# Patient Record
Sex: Female | Born: 1935 | Race: White | Hispanic: No | Smoking: Former smoker
Health system: Southern US, Community
[De-identification: ages and names within clinical notes are randomized; demographics above are authoritative.]

## PROBLEM LIST (undated history)

## (undated) DIAGNOSIS — J189 Pneumonia, unspecified organism: Secondary | ICD-10-CM

## (undated) DIAGNOSIS — M810 Age-related osteoporosis without current pathological fracture: Secondary | ICD-10-CM

## (undated) DIAGNOSIS — I639 Cerebral infarction, unspecified: Secondary | ICD-10-CM

## (undated) DIAGNOSIS — N200 Calculus of kidney: Secondary | ICD-10-CM

## (undated) DIAGNOSIS — Z87440 Personal history of urinary (tract) infections: Secondary | ICD-10-CM

## (undated) DIAGNOSIS — G629 Polyneuropathy, unspecified: Secondary | ICD-10-CM

## (undated) DIAGNOSIS — J449 Chronic obstructive pulmonary disease, unspecified: Secondary | ICD-10-CM

## (undated) DIAGNOSIS — C9590 Leukemia, unspecified not having achieved remission: Secondary | ICD-10-CM

## (undated) DIAGNOSIS — C50912 Malignant neoplasm of unspecified site of left female breast: Secondary | ICD-10-CM

## (undated) DIAGNOSIS — C50919 Malignant neoplasm of unspecified site of unspecified female breast: Secondary | ICD-10-CM

## (undated) DIAGNOSIS — E039 Hypothyroidism, unspecified: Secondary | ICD-10-CM

## (undated) HISTORY — PX: OVARIAN CYST REMOVAL: SHX89

## (undated) HISTORY — DX: Pneumonia, unspecified organism: J18.9

## (undated) HISTORY — PX: REPLACEMENT TOTAL KNEE: SUR1224

## (undated) HISTORY — PX: CHOLECYSTECTOMY: SHX55

## (undated) HISTORY — PX: APPENDECTOMY: SHX54

## (undated) HISTORY — DX: Chronic obstructive pulmonary disease, unspecified: J44.9

## (undated) HISTORY — PX: BARTHOLIN GLAND CYST EXCISION: SHX565

## (undated) HISTORY — PX: CARDIAC CATHETERIZATION: SHX172

## (undated) HISTORY — DX: Cerebral infarction, unspecified: I63.9

## (undated) HISTORY — DX: Age-related osteoporosis without current pathological fracture: M81.0

## (undated) HISTORY — DX: Calculus of kidney: N20.0

## (undated) HISTORY — DX: Hypothyroidism, unspecified: E03.9

## (undated) HISTORY — PX: BREAST LUMPECTOMY: SHX2

## (undated) HISTORY — PX: ABDOMINAL HYSTERECTOMY: SHX81

## (undated) HISTORY — DX: Malignant neoplasm of unspecified site of left female breast: C50.912

## (undated) HISTORY — DX: Leukemia, unspecified not having achieved remission: C95.90

## (undated) HISTORY — DX: Personal history of urinary (tract) infections: Z87.440

## (undated) HISTORY — PX: LITHOTRIPSY: SUR834

## (undated) HISTORY — DX: Malignant neoplasm of unspecified site of unspecified female breast: C50.919

## (undated) HISTORY — PX: TOTAL HIP ARTHROPLASTY: SHX124

---
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
## 1977-01-13 HISTORY — PX: KIDNEY STONE SURGERY: SHX686

## 1997-04-20 ENCOUNTER — Ambulatory Visit (HOSPITAL_BASED_OUTPATIENT_CLINIC_OR_DEPARTMENT_OTHER): Admission: RE | Admit: 1997-04-20 | Discharge: 1997-04-20 | Payer: Self-pay | Admitting: General Surgery

## 1998-06-04 ENCOUNTER — Encounter: Payer: Self-pay | Admitting: Orthopedic Surgery

## 1998-06-06 ENCOUNTER — Inpatient Hospital Stay (HOSPITAL_COMMUNITY): Admission: RE | Admit: 1998-06-06 | Discharge: 1998-06-18 | Payer: Self-pay | Admitting: Orthopedic Surgery

## 1998-06-11 ENCOUNTER — Encounter: Payer: Self-pay | Admitting: Orthopedic Surgery

## 1998-06-12 ENCOUNTER — Encounter: Payer: Self-pay | Admitting: Orthopedic Surgery

## 1998-06-13 ENCOUNTER — Encounter: Payer: Self-pay | Admitting: Orthopedic Surgery

## 1998-06-14 ENCOUNTER — Encounter: Payer: Self-pay | Admitting: Orthopedic Surgery

## 1998-06-15 ENCOUNTER — Encounter: Payer: Self-pay | Admitting: Orthopedic Surgery

## 2001-06-15 ENCOUNTER — Ambulatory Visit (HOSPITAL_COMMUNITY): Admission: RE | Admit: 2001-06-15 | Discharge: 2001-06-15 | Payer: Self-pay | Admitting: Family Medicine

## 2001-06-15 ENCOUNTER — Encounter: Payer: Self-pay | Admitting: Family Medicine

## 2001-12-02 ENCOUNTER — Encounter: Payer: Self-pay | Admitting: Family Medicine

## 2001-12-02 ENCOUNTER — Ambulatory Visit (HOSPITAL_COMMUNITY): Admission: RE | Admit: 2001-12-02 | Discharge: 2001-12-02 | Payer: Self-pay | Admitting: Family Medicine

## 2002-01-05 ENCOUNTER — Inpatient Hospital Stay (HOSPITAL_COMMUNITY): Admission: EM | Admit: 2002-01-05 | Discharge: 2002-01-08 | Payer: Self-pay | Admitting: Emergency Medicine

## 2002-01-05 ENCOUNTER — Encounter: Payer: Self-pay | Admitting: Emergency Medicine

## 2002-01-07 ENCOUNTER — Encounter: Payer: Self-pay | Admitting: Internal Medicine

## 2002-06-22 ENCOUNTER — Encounter: Payer: Self-pay | Admitting: Internal Medicine

## 2002-06-22 DIAGNOSIS — K2900 Acute gastritis without bleeding: Secondary | ICD-10-CM | POA: Insufficient documentation

## 2002-06-22 DIAGNOSIS — D126 Benign neoplasm of colon, unspecified: Secondary | ICD-10-CM

## 2002-07-31 ENCOUNTER — Emergency Department (HOSPITAL_COMMUNITY): Admission: EM | Admit: 2002-07-31 | Discharge: 2002-07-31 | Payer: Self-pay | Admitting: Emergency Medicine

## 2002-09-28 ENCOUNTER — Encounter: Payer: Self-pay | Admitting: Family Medicine

## 2002-09-28 ENCOUNTER — Ambulatory Visit (HOSPITAL_COMMUNITY): Admission: RE | Admit: 2002-09-28 | Discharge: 2002-09-28 | Payer: Self-pay | Admitting: Family Medicine

## 2003-02-15 ENCOUNTER — Ambulatory Visit (HOSPITAL_COMMUNITY): Admission: RE | Admit: 2003-02-15 | Discharge: 2003-02-15 | Payer: Self-pay | Admitting: Family Medicine

## 2003-03-02 ENCOUNTER — Ambulatory Visit (HOSPITAL_COMMUNITY): Admission: RE | Admit: 2003-03-02 | Discharge: 2003-03-02 | Payer: Self-pay | Admitting: Family Medicine

## 2003-03-07 ENCOUNTER — Encounter: Admission: RE | Admit: 2003-03-07 | Discharge: 2003-03-07 | Payer: Self-pay | Admitting: Oncology

## 2003-03-07 ENCOUNTER — Encounter (HOSPITAL_COMMUNITY): Admission: RE | Admit: 2003-03-07 | Discharge: 2003-04-06 | Payer: Self-pay | Admitting: Oncology

## 2003-03-13 ENCOUNTER — Ambulatory Visit (HOSPITAL_COMMUNITY): Admission: RE | Admit: 2003-03-13 | Discharge: 2003-03-13 | Payer: Self-pay | Admitting: Pulmonary Disease

## 2003-08-14 ENCOUNTER — Ambulatory Visit (HOSPITAL_COMMUNITY): Admission: RE | Admit: 2003-08-14 | Discharge: 2003-08-14 | Payer: Self-pay | Admitting: *Deleted

## 2003-09-08 ENCOUNTER — Ambulatory Visit (HOSPITAL_COMMUNITY): Admission: RE | Admit: 2003-09-08 | Discharge: 2003-09-08 | Payer: Self-pay | Admitting: Family Medicine

## 2003-09-20 ENCOUNTER — Encounter: Admission: RE | Admit: 2003-09-20 | Discharge: 2003-10-13 | Payer: Self-pay | Admitting: Oncology

## 2004-02-05 ENCOUNTER — Ambulatory Visit (HOSPITAL_COMMUNITY): Admission: RE | Admit: 2004-02-05 | Discharge: 2004-02-05 | Payer: Self-pay | Admitting: Family Medicine

## 2004-08-16 ENCOUNTER — Encounter: Admission: RE | Admit: 2004-08-16 | Discharge: 2004-08-16 | Payer: Self-pay | Admitting: Oncology

## 2004-08-16 ENCOUNTER — Ambulatory Visit (HOSPITAL_COMMUNITY): Payer: Self-pay | Admitting: Oncology

## 2004-08-16 ENCOUNTER — Encounter (HOSPITAL_COMMUNITY): Admission: RE | Admit: 2004-08-16 | Discharge: 2004-09-15 | Payer: Self-pay | Admitting: Oncology

## 2005-02-26 ENCOUNTER — Encounter: Admission: RE | Admit: 2005-02-26 | Discharge: 2005-02-26 | Payer: Self-pay | Admitting: Oncology

## 2005-02-26 ENCOUNTER — Encounter (HOSPITAL_COMMUNITY): Admission: RE | Admit: 2005-02-26 | Discharge: 2005-03-28 | Payer: Self-pay | Admitting: Oncology

## 2005-07-01 ENCOUNTER — Emergency Department (HOSPITAL_COMMUNITY): Admission: EM | Admit: 2005-07-01 | Discharge: 2005-07-01 | Payer: Self-pay | Admitting: Emergency Medicine

## 2005-07-07 ENCOUNTER — Ambulatory Visit (HOSPITAL_COMMUNITY): Admission: RE | Admit: 2005-07-07 | Discharge: 2005-07-07 | Payer: Self-pay | Admitting: Urology

## 2006-06-22 ENCOUNTER — Ambulatory Visit (HOSPITAL_COMMUNITY): Admission: RE | Admit: 2006-06-22 | Discharge: 2006-06-22 | Payer: Self-pay | Admitting: Family Medicine

## 2006-07-10 ENCOUNTER — Ambulatory Visit (HOSPITAL_COMMUNITY): Admission: RE | Admit: 2006-07-10 | Discharge: 2006-07-10 | Payer: Self-pay | Admitting: Family Medicine

## 2006-08-25 ENCOUNTER — Ambulatory Visit (HOSPITAL_COMMUNITY): Payer: Self-pay | Admitting: Oncology

## 2006-12-10 ENCOUNTER — Encounter (INDEPENDENT_AMBULATORY_CARE_PROVIDER_SITE_OTHER): Payer: Self-pay | Admitting: Family Medicine

## 2006-12-10 ENCOUNTER — Observation Stay (HOSPITAL_COMMUNITY): Admission: EM | Admit: 2006-12-10 | Discharge: 2006-12-11 | Payer: Self-pay | Admitting: Emergency Medicine

## 2006-12-24 ENCOUNTER — Ambulatory Visit: Payer: Self-pay | Admitting: Internal Medicine

## 2007-01-11 DIAGNOSIS — N200 Calculus of kidney: Secondary | ICD-10-CM | POA: Insufficient documentation

## 2007-01-11 DIAGNOSIS — J449 Chronic obstructive pulmonary disease, unspecified: Secondary | ICD-10-CM

## 2007-01-11 DIAGNOSIS — J4489 Other specified chronic obstructive pulmonary disease: Secondary | ICD-10-CM | POA: Insufficient documentation

## 2007-01-11 DIAGNOSIS — E785 Hyperlipidemia, unspecified: Secondary | ICD-10-CM | POA: Insufficient documentation

## 2007-01-11 DIAGNOSIS — R0602 Shortness of breath: Secondary | ICD-10-CM | POA: Insufficient documentation

## 2007-01-11 DIAGNOSIS — I6350 Cerebral infarction due to unspecified occlusion or stenosis of unspecified cerebral artery: Secondary | ICD-10-CM | POA: Insufficient documentation

## 2007-01-11 DIAGNOSIS — E232 Diabetes insipidus: Secondary | ICD-10-CM

## 2007-01-11 DIAGNOSIS — C9110 Chronic lymphocytic leukemia of B-cell type not having achieved remission: Secondary | ICD-10-CM

## 2007-01-11 DIAGNOSIS — K269 Duodenal ulcer, unspecified as acute or chronic, without hemorrhage or perforation: Secondary | ICD-10-CM | POA: Insufficient documentation

## 2007-01-11 DIAGNOSIS — G459 Transient cerebral ischemic attack, unspecified: Secondary | ICD-10-CM | POA: Insufficient documentation

## 2007-02-04 ENCOUNTER — Ambulatory Visit (HOSPITAL_COMMUNITY): Admission: RE | Admit: 2007-02-04 | Discharge: 2007-02-04 | Payer: Self-pay | Admitting: Family Medicine

## 2007-02-08 ENCOUNTER — Ambulatory Visit (HOSPITAL_COMMUNITY): Admission: RE | Admit: 2007-02-08 | Discharge: 2007-02-08 | Payer: Self-pay | Admitting: Family Medicine

## 2007-10-26 ENCOUNTER — Ambulatory Visit (HOSPITAL_COMMUNITY): Admission: RE | Admit: 2007-10-26 | Discharge: 2007-10-26 | Payer: Self-pay | Admitting: Family Medicine

## 2008-11-07 ENCOUNTER — Inpatient Hospital Stay (HOSPITAL_COMMUNITY): Admission: EM | Admit: 2008-11-07 | Discharge: 2008-11-08 | Payer: Self-pay | Admitting: Emergency Medicine

## 2009-09-03 ENCOUNTER — Ambulatory Visit (HOSPITAL_COMMUNITY): Payer: Self-pay | Admitting: Oncology

## 2009-09-03 ENCOUNTER — Encounter (HOSPITAL_COMMUNITY): Admission: RE | Admit: 2009-09-03 | Discharge: 2009-10-03 | Payer: Self-pay | Admitting: Oncology

## 2009-09-06 ENCOUNTER — Emergency Department (HOSPITAL_COMMUNITY): Admission: EM | Admit: 2009-09-06 | Discharge: 2009-09-06 | Payer: Self-pay | Admitting: Emergency Medicine

## 2009-09-13 DIAGNOSIS — I639 Cerebral infarction, unspecified: Secondary | ICD-10-CM

## 2009-09-13 HISTORY — DX: Cerebral infarction, unspecified: I63.9

## 2009-10-11 ENCOUNTER — Inpatient Hospital Stay (HOSPITAL_COMMUNITY): Admission: EM | Admit: 2009-10-11 | Discharge: 2009-10-13 | Payer: Self-pay | Admitting: Emergency Medicine

## 2009-10-11 ENCOUNTER — Ambulatory Visit: Payer: Self-pay | Admitting: Cardiology

## 2009-10-12 ENCOUNTER — Encounter (INDEPENDENT_AMBULATORY_CARE_PROVIDER_SITE_OTHER): Payer: Self-pay | Admitting: Internal Medicine

## 2009-11-02 ENCOUNTER — Encounter (HOSPITAL_COMMUNITY)
Admission: RE | Admit: 2009-11-02 | Discharge: 2009-12-02 | Payer: Self-pay | Source: Home / Self Care | Admitting: Oncology

## 2010-01-25 ENCOUNTER — Encounter (HOSPITAL_COMMUNITY)
Admission: RE | Admit: 2010-01-25 | Discharge: 2010-02-12 | Payer: Self-pay | Source: Home / Self Care | Attending: Oncology | Admitting: Oncology

## 2010-01-25 ENCOUNTER — Ambulatory Visit (HOSPITAL_COMMUNITY)
Admission: RE | Admit: 2010-01-25 | Discharge: 2010-02-12 | Payer: Self-pay | Source: Home / Self Care | Attending: Oncology | Admitting: Oncology

## 2010-01-28 LAB — CBC
HCT: 42.7 % (ref 36.0–46.0)
Hemoglobin: 14.6 g/dL (ref 12.0–15.0)
MCH: 32.6 pg (ref 26.0–34.0)
MCHC: 34.2 g/dL (ref 30.0–36.0)
MCV: 95.3 fL (ref 78.0–100.0)
Platelets: 141 10*3/uL — ABNORMAL LOW (ref 150–400)
RBC: 4.48 MIL/uL (ref 3.87–5.11)
RDW: 13.4 % (ref 11.5–15.5)
WBC: 18.8 10*3/uL — ABNORMAL HIGH (ref 4.0–10.5)

## 2010-01-28 LAB — DIFFERENTIAL
Basophils Absolute: 0 10*3/uL (ref 0.0–0.1)
Basophils Relative: 0 % (ref 0–1)
Eosinophils Absolute: 0.1 10*3/uL (ref 0.0–0.7)
Eosinophils Relative: 1 % (ref 0–5)
Lymphocytes Relative: 70 % — ABNORMAL HIGH (ref 12–46)
Lymphs Abs: 13.2 10*3/uL — ABNORMAL HIGH (ref 0.7–4.0)
Monocytes Absolute: 0.3 10*3/uL (ref 0.1–1.0)
Monocytes Relative: 2 % — ABNORMAL LOW (ref 3–12)
Neutro Abs: 5.2 10*3/uL (ref 1.7–7.7)
Neutrophils Relative %: 28 % — ABNORMAL LOW (ref 43–77)

## 2010-02-03 ENCOUNTER — Encounter: Payer: Self-pay | Admitting: Urology

## 2010-03-27 LAB — DIFFERENTIAL
Basophils Absolute: 0 10*3/uL (ref 0.0–0.1)
Basophils Relative: 0 % (ref 0–1)
Eosinophils Absolute: 0.1 10*3/uL (ref 0.0–0.7)
Eosinophils Relative: 1 % (ref 0–5)
Eosinophils Relative: 1 % (ref 0–5)
Lymphocytes Relative: 68 % — ABNORMAL HIGH (ref 12–46)
Lymphocytes Relative: 66 % — ABNORMAL HIGH (ref 12–46)
Lymphs Abs: 11.2 10*3/uL — ABNORMAL HIGH (ref 0.7–4.0)
Lymphs Abs: 7.8 10*3/uL — ABNORMAL HIGH (ref 0.7–4.0)
Monocytes Absolute: 0.5 10*3/uL (ref 0.1–1.0)
Monocytes Relative: 2 % — ABNORMAL LOW (ref 3–12)
Monocytes Relative: 4 % (ref 3–12)
Neutro Abs: 3.4 10*3/uL (ref 1.7–7.7)
Neutrophils Relative %: 28 % — ABNORMAL LOW (ref 43–77)
Neutrophils Relative %: 29 % — ABNORMAL LOW (ref 43–77)

## 2010-03-27 LAB — CBC
HCT: 38.3 % (ref 36.0–46.0)
Hemoglobin: 13.7 g/dL (ref 12.0–15.0)
Hemoglobin: 13.2 g/dL (ref 12.0–15.0)
MCH: 33.5 pg (ref 26.0–34.0)
MCH: 33.4 pg (ref 26.0–34.0)
MCHC: 34.5 g/dL (ref 30.0–36.0)
MCHC: 34.5 g/dL (ref 30.0–36.0)
MCV: 96.6 fL (ref 78.0–100.0)
Platelets: 118 10*3/uL — ABNORMAL LOW (ref 150–400)
RBC: 4.09 MIL/uL (ref 3.87–5.11)
RBC: 3.96 MIL/uL (ref 3.87–5.11)
RDW: 12.9 % (ref 11.5–15.5)
WBC: 11.8 10*3/uL — ABNORMAL HIGH (ref 4.0–10.5)

## 2010-03-27 LAB — GLUCOSE, CAPILLARY
Glucose-Capillary: 111 mg/dL — ABNORMAL HIGH (ref 70–99)
Glucose-Capillary: 129 mg/dL — ABNORMAL HIGH (ref 70–99)

## 2010-03-28 LAB — DIFFERENTIAL
Basophils Absolute: 0 10*3/uL (ref 0.0–0.1)
Basophils Relative: 1 % (ref 0–1)
Basophils Relative: 1 % (ref 0–1)
Eosinophils Absolute: 0.2 10*3/uL (ref 0.0–0.7)
Eosinophils Relative: 1 % (ref 0–5)
Eosinophils Relative: 2 % (ref 0–5)
Eosinophils Relative: 1 % (ref 0–5)
Lymphocytes Relative: 70 % — ABNORMAL HIGH (ref 12–46)
Lymphs Abs: 10.6 10*3/uL — ABNORMAL HIGH (ref 0.7–4.0)
Lymphs Abs: 9.5 10*3/uL — ABNORMAL HIGH (ref 0.7–4.0)
Monocytes Absolute: 0.4 10*3/uL (ref 0.1–1.0)
Monocytes Relative: 3 % (ref 3–12)
Monocytes Relative: 3 % (ref 3–12)
Monocytes Relative: 3 % (ref 3–12)
Neutro Abs: 4.1 10*3/uL (ref 1.7–7.7)
Neutrophils Relative %: 26 % — ABNORMAL LOW (ref 43–77)

## 2010-03-28 LAB — GLUCOSE, CAPILLARY
Glucose-Capillary: 114 mg/dL — ABNORMAL HIGH (ref 70–99)
Glucose-Capillary: 121 mg/dL — ABNORMAL HIGH (ref 70–99)
Glucose-Capillary: 123 mg/dL — ABNORMAL HIGH (ref 70–99)

## 2010-03-28 LAB — CBC
HCT: 37.3 % (ref 36.0–46.0)
HCT: 38.2 % (ref 36.0–46.0)
HCT: 39.9 % (ref 36.0–46.0)
Hemoglobin: 12.8 g/dL (ref 12.0–15.0)
Hemoglobin: 13.7 g/dL (ref 12.0–15.0)
MCH: 33.3 pg (ref 26.0–34.0)
MCH: 33.4 pg (ref 26.0–34.0)
MCHC: 34.4 g/dL (ref 30.0–36.0)
MCHC: 34.7 g/dL (ref 30.0–36.0)
MCHC: 34.3 g/dL (ref 30.0–36.0)
MCV: 97 fL (ref 78.0–100.0)
MCV: 97.3 fL (ref 78.0–100.0)
Platelets: 121 10*3/uL — ABNORMAL LOW (ref 150–400)
RDW: 12.9 % (ref 11.5–15.5)
RDW: 13.4 % (ref 11.5–15.5)
WBC: 15.3 10*3/uL — ABNORMAL HIGH (ref 4.0–10.5)

## 2010-03-28 LAB — COMPREHENSIVE METABOLIC PANEL
Albumin: 3.9 g/dL (ref 3.5–5.2)
Alkaline Phosphatase: 42 U/L (ref 39–117)
BUN: 14 mg/dL (ref 6–23)
Calcium: 8.8 mg/dL (ref 8.4–10.5)
Creatinine, Ser: 0.91 mg/dL (ref 0.4–1.2)
Glucose, Bld: 124 mg/dL — ABNORMAL HIGH (ref 70–99)
Potassium: 3.3 mEq/L — ABNORMAL LOW (ref 3.5–5.1)
Total Protein: 6.2 g/dL (ref 6.0–8.3)

## 2010-03-28 LAB — IRON AND TIBC
Iron: 70 ug/dL (ref 42–135)
UIBC: 267 ug/dL

## 2010-03-28 LAB — LIPID PANEL
HDL: 37 mg/dL — ABNORMAL LOW (ref >39)
Total CHOL/HDL Ratio: 4.5 RATIO
Triglycerides: 103 mg/dL (ref <150)
VLDL: 21 mg/dL (ref 0–40)

## 2010-03-28 LAB — BASIC METABOLIC PANEL
BUN: 11 mg/dL (ref 6–23)
Calcium: 8.7 mg/dL (ref 8.4–10.5)
GFR calc non Af Amer: >60 mL/min (ref >60)
Glucose, Bld: 121 mg/dL — ABNORMAL HIGH (ref 70–99)
Sodium: 143 mEq/L (ref 135–145)

## 2010-03-28 LAB — URINALYSIS, ROUTINE W REFLEX MICROSCOPIC
Glucose, UA: NEGATIVE mg/dL
Ketones, ur: NEGATIVE mg/dL
Leukocytes, UA: NEGATIVE
Protein, ur: NEGATIVE mg/dL
pH: 6 (ref 5.0–8.0)

## 2010-03-28 LAB — PROTIME-INR
INR: 1 (ref 0.00–1.49)
Prothrombin Time: 13.4 seconds (ref 11.6–15.2)

## 2010-03-28 LAB — CARDIAC PANEL(CRET KIN+CKTOT+MB+TROPI)
CK, MB: 0.8 ng/mL (ref 0.3–4.0)
Troponin I: <0.01 ng/mL (ref 0.00–0.06)

## 2010-03-28 LAB — FERRITIN: Ferritin: 50 ng/mL (ref 10–291)

## 2010-03-28 LAB — RETICULOCYTES
RBC.: 4.1 MIL/uL (ref 3.87–5.11)
Retic Ct Pct: 1.3 % (ref 0.4–3.1)

## 2010-03-28 LAB — VITAMIN B12: Vitamin B-12: 235 pg/mL (ref 211–911)

## 2010-03-28 LAB — APTT: aPTT: 26 seconds (ref 24–37)

## 2010-03-28 LAB — URINE MICROSCOPIC-ADD ON

## 2010-03-28 LAB — TROPONIN I: Troponin I: <0.01 ng/mL (ref 0.00–0.06)

## 2010-03-30 ENCOUNTER — Emergency Department (HOSPITAL_COMMUNITY): Payer: PRIVATE HEALTH INSURANCE

## 2010-03-30 ENCOUNTER — Emergency Department (HOSPITAL_COMMUNITY)
Admission: EM | Admit: 2010-03-30 | Discharge: 2010-03-30 | Disposition: A | Discharge status: Home / Self Care | Payer: PRIVATE HEALTH INSURANCE | Attending: Emergency Medicine | Admitting: Emergency Medicine

## 2010-03-30 DIAGNOSIS — Y929 Unspecified place or not applicable: Secondary | ICD-10-CM | POA: Insufficient documentation

## 2010-03-30 DIAGNOSIS — J449 Chronic obstructive pulmonary disease, unspecified: Secondary | ICD-10-CM | POA: Insufficient documentation

## 2010-03-30 DIAGNOSIS — Z8673 Personal history of transient ischemic attack (TIA), and cerebral infarction without residual deficits: Secondary | ICD-10-CM | POA: Insufficient documentation

## 2010-03-30 DIAGNOSIS — S83106A Unspecified dislocation of unspecified knee, initial encounter: Secondary | ICD-10-CM | POA: Insufficient documentation

## 2010-03-30 DIAGNOSIS — M25569 Pain in unspecified knee: Secondary | ICD-10-CM | POA: Insufficient documentation

## 2010-03-30 DIAGNOSIS — W19XXXA Unspecified fall, initial encounter: Secondary | ICD-10-CM | POA: Insufficient documentation

## 2010-03-30 DIAGNOSIS — E119 Type 2 diabetes mellitus without complications: Secondary | ICD-10-CM | POA: Insufficient documentation

## 2010-03-30 DIAGNOSIS — J4489 Other specified chronic obstructive pulmonary disease: Secondary | ICD-10-CM | POA: Insufficient documentation

## 2010-03-30 DIAGNOSIS — K219 Gastro-esophageal reflux disease without esophagitis: Secondary | ICD-10-CM | POA: Insufficient documentation

## 2010-03-30 DIAGNOSIS — Z856 Personal history of leukemia: Secondary | ICD-10-CM | POA: Insufficient documentation

## 2010-04-01 ENCOUNTER — Inpatient Hospital Stay (HOSPITAL_COMMUNITY): Payer: PRIVATE HEALTH INSURANCE

## 2010-04-01 ENCOUNTER — Inpatient Hospital Stay (HOSPITAL_COMMUNITY)
Admission: EM | Admit: 2010-04-01 | Discharge: 2010-04-03 | DRG: 689 | Disposition: A | Discharge status: Skilled Nursing Facility | Payer: PRIVATE HEALTH INSURANCE | Attending: Internal Medicine | Admitting: Internal Medicine

## 2010-04-01 ENCOUNTER — Emergency Department (HOSPITAL_COMMUNITY): Payer: PRIVATE HEALTH INSURANCE

## 2010-04-01 DIAGNOSIS — E119 Type 2 diabetes mellitus without complications: Secondary | ICD-10-CM | POA: Diagnosis present

## 2010-04-01 DIAGNOSIS — S8000XA Contusion of unspecified knee, initial encounter: Secondary | ICD-10-CM | POA: Diagnosis present

## 2010-04-01 DIAGNOSIS — M25469 Effusion, unspecified knee: Secondary | ICD-10-CM | POA: Diagnosis present

## 2010-04-01 DIAGNOSIS — W19XXXA Unspecified fall, initial encounter: Secondary | ICD-10-CM | POA: Diagnosis present

## 2010-04-01 DIAGNOSIS — A498 Other bacterial infections of unspecified site: Secondary | ICD-10-CM | POA: Diagnosis present

## 2010-04-01 DIAGNOSIS — I4510 Unspecified right bundle-branch block: Secondary | ICD-10-CM | POA: Diagnosis present

## 2010-04-01 DIAGNOSIS — Z8673 Personal history of transient ischemic attack (TIA), and cerebral infarction without residual deficits: Secondary | ICD-10-CM

## 2010-04-01 DIAGNOSIS — J189 Pneumonia, unspecified organism: Secondary | ICD-10-CM | POA: Diagnosis present

## 2010-04-01 DIAGNOSIS — E876 Hypokalemia: Secondary | ICD-10-CM | POA: Diagnosis present

## 2010-04-01 DIAGNOSIS — Z96659 Presence of unspecified artificial knee joint: Secondary | ICD-10-CM

## 2010-04-01 DIAGNOSIS — N390 Urinary tract infection, site not specified: Principal | ICD-10-CM | POA: Diagnosis present

## 2010-04-01 DIAGNOSIS — E785 Hyperlipidemia, unspecified: Secondary | ICD-10-CM | POA: Diagnosis present

## 2010-04-01 DIAGNOSIS — Z66 Do not resuscitate: Secondary | ICD-10-CM | POA: Diagnosis present

## 2010-04-01 DIAGNOSIS — J019 Acute sinusitis, unspecified: Secondary | ICD-10-CM | POA: Diagnosis present

## 2010-04-01 LAB — COMPREHENSIVE METABOLIC PANEL
AST: 17 U/L (ref 0–37)
Albumin: 3.1 g/dL — ABNORMAL LOW (ref 3.5–5.2)
Alkaline Phosphatase: 58 U/L (ref 39–117)
Chloride: 97 mEq/L (ref 96–112)
GFR calc Af Amer: >60 mL/min (ref >60)
Potassium: 2.8 mEq/L — ABNORMAL LOW (ref 3.5–5.1)
Sodium: 137 mEq/L (ref 135–145)
Total Bilirubin: 0.9 mg/dL (ref 0.3–1.2)

## 2010-04-01 LAB — URINE MICROSCOPIC-ADD ON

## 2010-04-01 LAB — CBC
HCT: 35 % — ABNORMAL LOW (ref 36.0–46.0)
Hemoglobin: 12.1 g/dL (ref 12.0–15.0)
MCHC: 34.6 g/dL (ref 30.0–36.0)
RBC: 3.73 MIL/uL — ABNORMAL LOW (ref 3.87–5.11)
WBC: 18.4 10*3/uL — ABNORMAL HIGH (ref 4.0–10.5)

## 2010-04-01 LAB — POCT I-STAT, CHEM 8
Chloride: 100 mEq/L (ref 96–112)
Glucose, Bld: 155 mg/dL — ABNORMAL HIGH (ref 70–99)
HCT: 34 % — ABNORMAL LOW (ref 36.0–46.0)
Potassium: 2.7 mEq/L — CL (ref 3.5–5.1)

## 2010-04-01 LAB — URINALYSIS, ROUTINE W REFLEX MICROSCOPIC
Bilirubin Urine: NEGATIVE
Specific Gravity, Urine: 1.02 (ref 1.005–1.030)
Urobilinogen, UA: 1 mg/dL (ref 0.0–1.0)

## 2010-04-01 LAB — GLUCOSE, CAPILLARY: Glucose-Capillary: 118 mg/dL — ABNORMAL HIGH (ref 70–99)

## 2010-04-02 ENCOUNTER — Inpatient Hospital Stay (HOSPITAL_COMMUNITY): Payer: PRIVATE HEALTH INSURANCE

## 2010-04-02 LAB — DIFFERENTIAL
Basophils Relative: 0 % (ref 0–1)
Lymphs Abs: 7.1 10*3/uL — ABNORMAL HIGH (ref 0.7–4.0)
Monocytes Relative: 4 % (ref 3–12)
Neutro Abs: 5.3 10*3/uL (ref 1.7–7.7)
Neutrophils Relative %: 40 % — ABNORMAL LOW (ref 43–77)

## 2010-04-02 LAB — HEMOGLOBIN A1C: Mean Plasma Glucose: 140 mg/dL — ABNORMAL HIGH (ref <117)

## 2010-04-02 LAB — CBC
HCT: 31 % — ABNORMAL LOW (ref 36.0–46.0)
MCV: 93.9 fL (ref 78.0–100.0)
Platelets: 168 10*3/uL (ref 150–400)
RBC: 3.3 MIL/uL — ABNORMAL LOW (ref 3.87–5.11)
WBC: 13.1 10*3/uL — ABNORMAL HIGH (ref 4.0–10.5)

## 2010-04-02 LAB — BASIC METABOLIC PANEL
BUN: 7 mg/dL (ref 6–23)
Chloride: 98 mEq/L (ref 96–112)
GFR calc non Af Amer: >60 mL/min (ref >60)
Potassium: 3.1 mEq/L — ABNORMAL LOW (ref 3.5–5.1)
Sodium: 137 mEq/L (ref 135–145)

## 2010-04-02 LAB — GLUCOSE, CAPILLARY: Glucose-Capillary: 134 mg/dL — ABNORMAL HIGH (ref 70–99)

## 2010-04-03 ENCOUNTER — Encounter: Payer: Self-pay | Admitting: Orthopedic Surgery

## 2010-04-03 ENCOUNTER — Inpatient Hospital Stay
Admission: RE | Admit: 2010-04-03 | Discharge: 2010-04-26 | DRG: 948 | Disposition: A | Discharge status: Home / Self Care | Payer: PRIVATE HEALTH INSURANCE | Source: Ambulatory Visit | Attending: Internal Medicine | Admitting: Internal Medicine

## 2010-04-03 DIAGNOSIS — Z139 Encounter for screening, unspecified: Principal | ICD-10-CM | POA: Diagnosis present

## 2010-04-03 LAB — DIFFERENTIAL
Basophils Absolute: 0 10*3/uL (ref 0.0–0.1)
Lymphocytes Relative: 61 % — ABNORMAL HIGH (ref 12–46)
Monocytes Absolute: 0.6 10*3/uL (ref 0.1–1.0)
Monocytes Relative: 4 % (ref 3–12)
Neutro Abs: 4.3 10*3/uL (ref 1.7–7.7)

## 2010-04-03 LAB — TSH: TSH: 1.799 u[IU]/mL (ref 0.350–4.500)

## 2010-04-03 LAB — BASIC METABOLIC PANEL
CO2: 29 mEq/L (ref 19–32)
Chloride: 104 mEq/L (ref 96–112)
GFR calc Af Amer: >60 mL/min (ref >60)
Sodium: 138 mEq/L (ref 135–145)

## 2010-04-03 LAB — CBC
Hemoglobin: 10.9 g/dL — ABNORMAL LOW (ref 12.0–15.0)
MCH: 31.7 pg (ref 26.0–34.0)
MCV: 95.6 fL (ref 78.0–100.0)
RBC: 3.44 MIL/uL — ABNORMAL LOW (ref 3.87–5.11)

## 2010-04-03 LAB — FOLATE: Folate: 5.2 ng/mL

## 2010-04-03 LAB — URINE CULTURE: Culture  Setup Time: 201203192030

## 2010-04-04 DIAGNOSIS — M25569 Pain in unspecified knee: Secondary | ICD-10-CM

## 2010-04-04 LAB — GLUCOSE, CAPILLARY: Glucose-Capillary: 135 mg/dL — ABNORMAL HIGH (ref 70–99)

## 2010-04-05 LAB — GLUCOSE, CAPILLARY
Glucose-Capillary: 119 mg/dL — ABNORMAL HIGH (ref 70–99)
Glucose-Capillary: 238 mg/dL — ABNORMAL HIGH (ref 70–99)

## 2010-04-06 LAB — GLUCOSE, CAPILLARY
Glucose-Capillary: 137 mg/dL — ABNORMAL HIGH (ref 70–99)
Glucose-Capillary: 146 mg/dL — ABNORMAL HIGH (ref 70–99)

## 2010-04-07 LAB — GLUCOSE, CAPILLARY: Glucose-Capillary: 145 mg/dL — ABNORMAL HIGH (ref 70–99)

## 2010-04-08 LAB — GLUCOSE, CAPILLARY
Glucose-Capillary: 111 mg/dL — ABNORMAL HIGH (ref 70–99)
Glucose-Capillary: 120 mg/dL — ABNORMAL HIGH (ref 70–99)
Glucose-Capillary: 136 mg/dL — ABNORMAL HIGH (ref 70–99)

## 2010-04-08 NOTE — Consult Note (Addendum)
  NAMESHYLEE, [name]               ACCOUNT NO.:  [id number]  MEDICAL RECORD NO.:  [id number]           PATIENT TYPE:  I  LOCATION:  S122                          FACILITY:  [facility]  PHYSICIAN:  Vickki Hearing, M.D.DATE OF BIRTH:  [birth year]  DATE OF CONSULTATION:  04/04/2010 DATE OF DISCHARGE:                                CONSULTATION   REASON FOR CONSULTATION:  Right knee pain, swelling and lack of weightbearing.  A full history and physical was dictated by the admitting physician.  I have reviewed it.  It is incorporated by reference.  This patient basically fell going up some stairs and then could not ambulate.  She had a right total knee replacement done by Dr. Arletha Grippe in [place] several years ago and was functioning well.  When I saw her, she complained of swelling, right knee pain which was diffuse, described as dull and throbbing and associated with swelling and inability to extend the knee or to ambulate.  The review of systems, the past family and social history again are incorporated by reference from the hospital history and physical.  PHYSICAL EXAMINATION:  The vital signs were stable.  The appearance was normal.  The patient was oriented x3.  Her mood was pleasant.  She was flat in bed and by report had not ambulated.  Her upper extremities on inspection were normal.  Range of motion was full.  Stability of the joints were normal.  Strength and muscle tone were excellent and skin was intact.  The right knee was indeed noted for an anterior midline incision consistent with total knee replacement.  There was an effusion and palpable tenderness diffusely.  The patient could not actively extend the knee, although the joint was stable.  She had weak quadriceps strength with gravity removed.  The strength assessment was 2/5.  Pulses were good.  Temperature was normal.  Sensation was intact.  There was no evidence of lymphangitis.  On examination of the right knee,  there appeared to be a patellar tendon defect with tenderness in the patellar tendon/patellar junction.  X-rays were negative except for the total knee replacement that was seen and seemed to be in good position.  A CAT will need to be done for further evaluation.  Preliminary diagnosis is patellar tendon rupture.     Vickki Hearing, M.D.     SEH/MEDQ  D:  04/04/2010  T:  04/04/2010  Job:  [id number]  Electronically Signed by Fuller Canada M.D. on 04/08/2010 01:39:58 PM

## 2010-04-09 LAB — GLUCOSE, CAPILLARY
Glucose-Capillary: 144 mg/dL — ABNORMAL HIGH (ref 70–99)
Glucose-Capillary: 103 mg/dL — ABNORMAL HIGH (ref 70–99)
Glucose-Capillary: 107 mg/dL — ABNORMAL HIGH (ref 70–99)

## 2010-04-10 LAB — GLUCOSE, CAPILLARY
Glucose-Capillary: 91 mg/dL (ref 70–99)
Glucose-Capillary: 140 mg/dL — ABNORMAL HIGH (ref 70–99)
Glucose-Capillary: 123 mg/dL — ABNORMAL HIGH (ref 70–99)

## 2010-04-11 LAB — GLUCOSE, CAPILLARY

## 2010-04-12 LAB — GLUCOSE, CAPILLARY
Glucose-Capillary: 92 mg/dL (ref 70–99)
Glucose-Capillary: 92 mg/dL (ref 70–99)
Glucose-Capillary: 102 mg/dL — ABNORMAL HIGH (ref 70–99)

## 2010-04-13 LAB — GLUCOSE, CAPILLARY: Glucose-Capillary: 85 mg/dL (ref 70–99)

## 2010-04-14 LAB — GLUCOSE, CAPILLARY: Glucose-Capillary: 90 mg/dL (ref 70–99)

## 2010-04-15 LAB — GLUCOSE, CAPILLARY: Glucose-Capillary: 94 mg/dL (ref 70–99)

## 2010-04-16 LAB — GLUCOSE, CAPILLARY: Glucose-Capillary: 71 mg/dL (ref 70–99)

## 2010-04-16 NOTE — Letter (Signed)
Summary: [facility] knee injury  [facility]   Imported By: Cammie Sickle 04/09/2010 10:36:28  _____________________________________________________________________  External Attachment:    Type:   Image     Comment:   External Document

## 2010-04-18 ENCOUNTER — Ambulatory Visit (HOSPITAL_COMMUNITY)
Admission: EM | Admit: 2010-04-18 | Discharge: 2010-04-18 | Disposition: A | Discharge status: Home / Self Care | Payer: PRIVATE HEALTH INSURANCE | Source: Other Acute Inpatient Hospital | Attending: Emergency Medicine | Admitting: Emergency Medicine

## 2010-04-18 DIAGNOSIS — Z139 Encounter for screening, unspecified: Secondary | ICD-10-CM | POA: Diagnosis not present

## 2010-04-18 LAB — GLUCOSE, CAPILLARY
Glucose-Capillary: 176 mg/dL — ABNORMAL HIGH (ref 70–99)
Glucose-Capillary: 181 mg/dL — ABNORMAL HIGH (ref 70–99)

## 2010-04-18 LAB — DIFFERENTIAL
Basophils Absolute: 0.2 10*3/uL — ABNORMAL HIGH (ref 0.0–0.1)
Eosinophils Absolute: 0.2 10*3/uL (ref 0.0–0.7)
Eosinophils Relative: 1 % (ref 0–5)
Lymphocytes Relative: 73 % — ABNORMAL HIGH (ref 12–46)
Lymphocytes Relative: 61 % — ABNORMAL HIGH (ref 12–46)
Lymphs Abs: 10.1 10*3/uL — ABNORMAL HIGH (ref 0.7–4.0)
Monocytes Absolute: 0.2 10*3/uL (ref 0.1–1.0)
Monocytes Relative: 0 % — ABNORMAL LOW (ref 3–12)
Neutrophils Relative %: 36 % — ABNORMAL LOW (ref 43–77)

## 2010-04-18 LAB — URINE MICROSCOPIC-ADD ON

## 2010-04-18 LAB — URINALYSIS, ROUTINE W REFLEX MICROSCOPIC
Bilirubin Urine: NEGATIVE
Bilirubin Urine: NEGATIVE
Glucose, UA: NEGATIVE mg/dL
Ketones, ur: NEGATIVE mg/dL
Ketones, ur: NEGATIVE mg/dL
Nitrite: NEGATIVE
Specific Gravity, Urine: 1.022 (ref 1.005–1.030)
Urobilinogen, UA: 0.2 mg/dL (ref 0.0–1.0)
pH: 6 (ref 5.0–8.0)
pH: 5.5 (ref 5.0–8.0)

## 2010-04-18 LAB — CBC
HCT: 36.5 % (ref 36.0–46.0)
HCT: 37.1 % (ref 36.0–46.0)
Hemoglobin: 12.2 g/dL (ref 12.0–15.0)
Hemoglobin: 13.2 g/dL (ref 12.0–15.0)
MCHC: 35 g/dL (ref 30.0–36.0)
MCHC: 35.7 g/dL (ref 30.0–36.0)
MCV: 95.1 fL (ref 78.0–100.0)
MCV: 95.6 fL (ref 78.0–100.0)
MCV: 95.8 fL (ref 78.0–100.0)
Platelets: 146 10*3/uL — ABNORMAL LOW (ref 150–400)
RBC: 3.84 MIL/uL — ABNORMAL LOW (ref 3.87–5.11)
RBC: 3.88 MIL/uL (ref 3.87–5.11)
RDW: 15.3 % (ref 11.5–15.5)
RDW: 13.1 % (ref 11.5–15.5)
RDW: 13.2 % (ref 11.5–15.5)
WBC: 13.8 10*3/uL — ABNORMAL HIGH (ref 4.0–10.5)

## 2010-04-18 LAB — POCT I-STAT, CHEM 8
BUN: 21 mg/dL (ref 6–23)
Calcium, Ion: 1.14 mmol/L (ref 1.12–1.32)
Chloride: 106 mEq/L (ref 96–112)
Glucose, Bld: 324 mg/dL — ABNORMAL HIGH (ref 70–99)
TCO2: 22 mmol/L (ref 0–100)

## 2010-04-18 LAB — HEPATIC FUNCTION PANEL
Albumin: 3.8 g/dL (ref 3.5–5.2)
Indirect Bilirubin: 0.4 mg/dL (ref 0.3–0.9)
Total Protein: 6.3 g/dL (ref 6.0–8.3)

## 2010-04-18 LAB — URINE CULTURE: Colony Count: >=100000

## 2010-04-18 LAB — PATHOLOGIST SMEAR REVIEW

## 2010-04-18 LAB — BASIC METABOLIC PANEL
BUN: 33 mg/dL — ABNORMAL HIGH (ref 6–23)
Chloride: 105 mEq/L (ref 96–112)
Potassium: 4.5 mEq/L (ref 3.5–5.1)

## 2010-04-19 LAB — GLUCOSE, CAPILLARY: Glucose-Capillary: 86 mg/dL (ref 70–99)

## 2010-04-22 LAB — GLUCOSE, CAPILLARY
Glucose-Capillary: 121 mg/dL — ABNORMAL HIGH (ref 70–99)
Glucose-Capillary: 161 mg/dL — ABNORMAL HIGH (ref 70–99)

## 2010-04-23 LAB — GLUCOSE, CAPILLARY: Glucose-Capillary: 118 mg/dL — ABNORMAL HIGH (ref 70–99)

## 2010-04-24 LAB — GLUCOSE, CAPILLARY
Glucose-Capillary: 117 mg/dL — ABNORMAL HIGH (ref 70–99)
Glucose-Capillary: 127 mg/dL — ABNORMAL HIGH (ref 70–99)

## 2010-05-01 NOTE — H&P (Signed)
[name], [name]               ACCOUNT NO.:  [id number]  MEDICAL RECORD NO.:  [id number]           PATIENT TYPE:  I  LOCATION:  A304                          FACILITY:  [facility]  PHYSICIAN:  [name], MDDATE OF BIRTH:  [birth year]  DATE OF ADMISSION:  04/01/2010 DATE OF DISCHARGE:  LH                             HISTORY & PHYSICAL   PRIMARY CARE PHYSICIAN:  Dr. Assunta Found with [facility].  CHIEF COMPLAINT:  Fall.  HISTORY OF PRESENT ILLNESS:  [name] is an 77 year old female with past medical history of type 2 diabetes, previous CVAs and multiple drug allergies who presented to [facility] Department today with complaints of fall.  Upon further discussion with the patient, she and family reports several-week history of productive cough and nasal congestion with frequent chills.  The patient also describes dysuria with recent treatment by primary care physician for urinary tract infection.  The patient states she just completed a 5-day course of gentamicin but continues to have some dysuria.  The patient denies any recent headache, dizziness, chest pain, shortness of breath, abdominal pain, nausea, vomiting or diarrhea.  The patient was also seen in the emergency department Saturday morning for a fall she suffered on Friday evening injuring her right knee.  At that time plain film did not show any acute fractures.  The patient reports continued right knee pain making it difficult for her to bear weight.  The patient also describes mechanical fall again this morning when attempting to walk from the bathroom back to her bed.  She states she was not using her walker at that time.  Upon evaluation in the emergency department, the patient found to have a white cell count of 18.4, potassium of 2.7 with urinary tract infection. The patient is to be admitted at this time by [facility] for further evaluation and treatment.  The patient and  family are also interested in skilled nursing facility placement at the time of discharge.  PAST MEDICAL HISTORY: 1. Previous CVAs with last one being during admission in September     2011. 2. Type 2 diabetes. 3. CLL. 4. Nephrolithiasis. 5. Dyslipidemia. 6. Right bundle branch block 7. Multiple drug allergies. 8. History of ruptured duodenal ulcer in 1998. 9. Remote cholecystectomy and appendectomy.  MEDICATIONS: 1. Tramadol 50 mg tab 1-2 tablets p.o. q.6 h. p.r.n. pain. 2. Tylenol Extra Strength 500 mg tab 1 tablet p.o. q.8 h. p.r.n. pain. 3. Metformin 500 mg p.o. b.i.d.  ALLERGIES: 1. CEPHALOSPORINS causes anaphylaxis. 2. PENICILLIN causes rash 3. PROMETHAZINE causes hallucinations. 4. CODEINE causes hallucinations. 5. MORPHINE causes hallucinations. 6. ASPIRIN unknown reaction.  However, the patient did tolerate during     hospitalization in September 2011. 7. QUINOLONES cause rash and swelling. 8. SULFA causes rash and swelling. 9. MACROBID causes swelling. 10.The patient also reports allergy to VYTORIN, VIOXX, PAXIL, ZYRTEC     and PRILOSEC.  FAMILY HISTORY:  Reviewed and noncontributory to this admit.  SOCIAL HISTORY:  The patient is widowed.  She lives currently with her daughter.  She is a remote smoker quitting in 1998.  No  EtOH use.  REVIEW OF SYSTEMS:  As stated in HPI, otherwise negative.  PHYSICAL EXAMINATION:  VITAL SIGNS:  Blood pressure 154/75, heart rate 78, respirations 18, temperature 97.7, O2 sat is 86% on room air. GENERAL:  This is an elderly white female sitting upright in bed, awake and alert, no acute distress. HEENT:  Head is normocephalic, atraumatic.  Eyes, extraocular movements are intact without scleral icterus or injection. Ear, nose and throat, mucous membranes are dry.  No oral pharyngeal lesions are noted. NECK:  Supple with no thyromegaly or lymphadenopathy.  No JVD or carotid bruits. CHEST:  With symmetrical movement, nontender  to palpation. CARDIOVASCULAR:  S1 and S2, regular rate and rhythm.  No murmur, rub or gallop.  No lower extremity edema. RESPIRATORY:  The patient with scattered rhonchi.  No wheezes, rales, no increased work of breathing. GI:  Abdomen is soft, nontender, nondistended with positive bowel sounds.  No appreciated masses or splenomegaly. NEUROLOGIC:  The patient is able to move all extremities x4 without motor sensory deficit on exam. PSYCHOLOGIC:  The patient is alert and oriented x4 with pleasant mood and affect. MUSCULOSKELETAL:  The patient does have significant swelling to right knee with evidence of joint effusion.  PERTINENT LABS AND ANCILLARY STUDIES:  White cell count 18.4, no differential obtained, platelet count 170, hemoglobin 12.1, hematocrit 35.0.  I-STAT shows sodium 135, potassium 2.7, chloride 100, CO2 28, BUN 9, creatinine 1.1, glucose 165.  Urinalysis; yellow, hazy with specific gravity of 1.020, trace blood, trace protein, positive nitrites.  Urine microscopic shows a 11-20 wbcs, many bacteriuria and epithelials.  ECG shows sinus rhythm at 84 beats per minute with chronic right bundle branch block.  Lumbar spine film shows no acute findings with diffuse osteopenia.  One-view chest x-ray shows stable cardiomegaly, recommending two-view followup.  Right knee film obtained on March 17 shows no fractures with large joint effusion.  ASSESSMENT AND PLAN: 1. Leukocytosis.  Likely related to patient's urinary tract infection     plus or minus recent fall and with history of CLL.  We will further     evaluate with two-view chest x-ray in setting of recent cough,     chills and inconclusive one-view chest x-ray we will order for him.     Antibiotic treatment for urinary tract infection.  Check culture     and sensitivity.  We will monitor white blood cell trend.  The     patient is afebrile, nontoxic appearing at the time of admission     evaluation. 2. Urinary tract  infection.  We will order for empiric gentamicin,     check urine culture and sensitivity. 3. Hypokalemia.  The patient with history of same.  We will replete IV     and recheck in the morning to determine need for further treatment. 4. Recent mechanical falls.  Suspect multifactorial in nature given     the patient's advanced age, chronic gait instability and with acute     issues.  We will ask for PT, OT to evaluate.  For any discharge     recommendations, we will order for fall precautions. 5. Right knee joint effusion.  We will ask orthopedics to evaluate as     this likely contributed to the patient's recurrent fall this     morning. 6. Type 2 diabetes.  We will hold the patient's metformin while     inpatient and order for sliding scale insulin.  Check hemoglobin     A1c.  7. Prophylaxis.  We will order for SCDs for DVT prophylaxis. 8. Disposition.  The patient is requesting short-term rehabilitation.     We will ask physical and occupational therapy to assess as well as     clinical social work consult for discharge planning. 9. Code status.  The patient requests DNR status.  Both the patient     and her children at bedside on admission evaluation verify     understanding of these orders.    Cordelia Pen, NP   ______________________________ Carla Camara. Lendell Caprice, [name]    LE/MEDQ  D:  04/01/2010  T:  04/01/2010  Job:  [id number]  cc:   [name], M.D. Fax: [phone]  Electronically Signed by Cordelia Pen NP on 04/05/2010 10:43:09 AM Electronically Signed by Crista Curb [name] on 05/01/2010 09:29:34 PM

## 2010-05-01 NOTE — Discharge Summary (Signed)
NAMEELDRED, LIEVANOS               ACCOUNT NO.:  [id number]  MEDICAL RECORD NO.:  [id number]           PATIENT TYPE:  I  LOCATION:  A304                          FACILITY:  [facility]  PHYSICIAN:  Kyal Arts L. Lendell Caprice, MDDATE OF BIRTH:  Sep 06, 1926  DATE OF ADMISSION:  04/01/2010 DATE OF DISCHARGE:  03/21/2012LH                              DISCHARGE SUMMARY   DISCHARGE DIAGNOSES: 1. Fall resulting in right knee contusion and hemarthrosis. 2. Escherichia coli urinary tract infection. 3. Community-acquired pneumonia. 4. Deconditioning secondary to above. 5. Reported confusion, none seen by myself, but reported by family     members. 6. History of stroke. 7. History of ruptured duodenal ulcer. 8. History of cholecystectomy and appendectomy. 9. Type 2 diabetes. 10.Dyslipidemia. 11.Chronic right bundle branch block. 12.Hypokalemia. 13.Acute sinusitis.  DISCHARGE MEDICATIONS: 1. Albuterol 2.5 mg inhaled every 2 hours as needed for wheezing. 2. Azithromycin 250 mg a day. 3. Gentamicin 230 mg every day until March 09, 2010, pharmacy to     assist with dosing. 4. Guaifenesin 5 mL p.o. q.6 h. p.r.n. cough. 5. Afrin 1 spray twice a day to each nostril for 3 days only. 6. Metformin 500 mg p.o. b.i.d. 7. Tramadol 50 mg one to two p.o. q.6 h. p.r.n. pain. 8. Tylenol 500 mg p.o. q.8 h. p.r.n. pain or fever.  DIET:  Should be diabetes, diabetic, and heart-healthy.  ACTIVITY:  She is to wear her T-scope brace 0-9 lock, 0 for ambulation. She may keep the brace off while in bed.  Physical therapy is to continue passive range of motion of the right knee and gait training.  CODE STATUS:  Do not resuscitate.  CONSULTATIONS:  Vickki Hearing, MD  PROCEDURES:  None.  FOLLOW UP:  She will need to follow up with Dr. Romeo Apple in 4 weeks. She will need to follow up with nursing home physician within a week. Also please follow up her TSH, B12, and folate level.  CONDITION:   Stable.  LABORATORIES ON ADMISSION:  Basic metabolic panel is significant for a potassium of 2.7, glucose 155, magnesium was normal.  Liver function tests significant for an albumin of 3.1.  At discharge, her potassium was 4.1.  Urinalysis was hazy, 15 ketones, trace blood, trace protein, positive nitrite negative leukocyte esterase, many squamous epithelial cells, 11-20 white cells, many bacteria.  Urine culture grew out greater than 100,000 colonies of pansensitive E-coli.  Hemoglobin A1c was 6.5.  DIAGNOSTIC DATA:  Knee x-ray 2 days prior to admission in the emergency room showed no evidence of fracture or hardware failure.  She is status post total knee arthroplasty, a large joint effusion.  One view chest x- ray showed cardiomegaly rotation, question confluence of shadows right lower lobe, follow up two-views of the chest showed probable bibasilar infiltrates small bibasilar pleural effusions, and enlargement of cardiac silhouette.  CT of the knee without contrast showed no tendon rupture, moderate-to-large hemarthrosis, no fracture, possible early loosening of tibial prosthesis.  CT of the brain without contrast showed atrophy, small vessel disease, extensive sinusitis, tiny unchanged right frontal calcified extra-axial structures stable from prior likely meningioma.  EKG  shows right bundle branch block, sinus rhythm, and left anterior fascicular block.  HISTORY AND HOSPITAL COURSE:  Please see H and P for details.  Ms. Rexroad is a pleasant 77 year old white female who was brought to the emergency room several times over the past week or so for falling.  She injured her knee and was unable to ambulate, so she was admitted to the hospitalist service.  She also was found to have a urinary tract infection.  A urinalysis was done as an outpatient and she was being treated for UTI with gentamicin intramuscularly as an outpatient.  She has multiple drug allergies including  fluoroquinolone, penicillin, sulfa, nitrofurantoin, cephalosporins, as well as multiple other drug allergies.  She also had a chest x-ray, which showed pneumonia and she was started on azithromycin without any reaction.  She did report a cough and sinus congestion for several weeks.  Her cough improved while here, she worked with physical therapy.  She was evaluated by Dr. Romeo Apple who recommended a locked brace while ambulating and range of motion exercises with PT.  He recommends that no arthrocentesis was done unless sign of infection, which she does not have in her knee.  Her code status is do not resuscitate upon admission, which will be continued.  During the hospitalization, she was reportedly confused according to her daughter.  She was talking about the dog in her room.  She did not know where she was and she was more forgetful than usual.  Her daughter reports that she has been progressively more forgetful and had memory problems for the past year or so.  She may have a underlying dementia. Her daughter was concerned that she may have hit her head and suffered a bleed.  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 Nevertheless, a CAT scan was done, which showed no bleed or other problem other than acute sinusitis.  She was started on Afrin and we will continue her antibiotics.  She will benefit from skilled nursing facility and will be transferred today.  Total time on the day of discharge is greater than 30 minutes.     Laporsha Grealish L. Lendell Caprice, MD     CLS/MEDQ  D:  04/03/2010  T:  04/03/2010  Job:  [id number]  Electronically Signed by Crista Curb MD on 05/01/2010 09:29:11 PM

## 2010-05-03 ENCOUNTER — Telehealth: Payer: Self-pay | Admitting: Orthopedic Surgery

## 2010-05-03 NOTE — Telephone Encounter (Signed)
Dianna Degante's daughter, [name] cancelled the 05/08/10 hospital follow-up appointment.  Nadra Hritz is at home, with home health serving her, and she is unable to get  The patient out of the house for the appointment.  Will reschedule when patient is able to come to the office

## 2010-05-08 ENCOUNTER — Ambulatory Visit: Payer: PRIVATE HEALTH INSURANCE | Admitting: Orthopedic Surgery

## 2010-05-28 NOTE — Group Therapy Note (Signed)
NAMEIVANA, NICASTRO               ACCOUNT NO.:  [id number]   MEDICAL RECORD NO.:  [id number]          PATIENT TYPE:  OBV   LOCATION:  A213                          FACILITY:  [facility]   PHYSICIAN:  Dorris Singh, DO    DATE OF BIRTH:  06-04-1926   DATE OF PROCEDURE:  12/10/2006  DATE OF DISCHARGE:                                 PROGRESS NOTE   The patient is seen this morning, resting comfortably in bed.  Said she  had no other episodes of any right-sided weakness or confusion.  Her  neuro checks have been normal since this evening.  Discussed with the  patient the plan that we will go ahead and see what testing we can  accomplish today and discharge her to see Dr. Phillips Odor, who is aware that  the patient is in.  He will be responsible for starting her on  medication.  Will go ahead and write a prescription for Plavix.  I  explained to the patient over the phone last night that I spoke with the  pharmacy at [facility], that she should not have a reaction to Plavix,  so I will go ahead and write her a prescription for that.   PHYSICAL EXAMINATION:  VITAL SIGNS:  Her temperature is 97.5, pulse 57,  respirations 16, blood pressure 135/70.  GENERAL:  The patient is a 77 year old Caucasian female who is well  developed, well nourished, in no acute distress.  HEART:  Regular rate and rhythm.  No murmurs, rubs, or gallops.  LUNGS:  Clear to auscultation bilaterally.  No wheezes, rales, or  rhonchi.  ABDOMEN:  Soft, nontender, nondistended.  Bowel sounds in all four  quadrants.   Her labs for this morning are still pending.  Will go ahead and recheck  those later on today or have signout doctor do that as well.   ASSESSMENT AND PLAN:  Transient ischemic attack/cerebrovascular  accident.  The patient is scheduled for a carotid Doppler, a 2D  echocardiogram, and an MRI of the brain for today.  If these tests are  normal, will go ahead and discharge the patient to see Dr. Phillips Odor, if  not  today, then on Saturday, for him to institute her current therapy.  The patient has stated that she would feel more comfortable, since she  has multiple allergies to medications, that her doctor, Dr. Phillips Odor or  Dr. Nobie Putnam, start her on these medications so that she can spend the  time and have the proper followup with them, and I agreed with that and  spoke with Dr. Phillips Odor regarding this as well.  The patient states  understanding of not starting medications while in the hospital, but has  agreed to immediate followup so that there is negligible delay for her  starting.  Also discussed with the patient at length the long-term  effects of what causes a CVA, did patient education, and how starting  medications can help decrease the risk of CVA as well.  Anticipate  discharge of patient today.      Dorris Singh, DO  Electronically Signed  CB/MEDQ  D:  12/11/2006  T:  12/11/2006  Job:  [id number]

## 2010-05-28 NOTE — Procedures (Signed)
[name], [name]               ACCOUNT NO.:  [id number]   MEDICAL RECORD NO.:  [id number]          PATIENT TYPE:  OBV   LOCATION:  A213                          FACILITY:  [facility]   PHYSICIAN:  Dani Gobble, MD       DATE OF BIRTH:  06-07-1926   DATE OF PROCEDURE:  12/11/2006  DATE OF DISCHARGE:  12/11/2006                                ECHOCARDIOGRAM   REFERRING PHYSICIAN:  Dorris Singh, DO, [facility].   INDICATIONS:  This is a 77 year old female admitted with TIA and stroke  who has diabetes and possibly hypertension.   The technical quality of the study is adequate.   The aorta measures normally at 3.3 cm.   The left atrium is moderately dilated and measured 5.2 cm.  The patient  appeared to be in sinus rhythm during this procedure.  No obvious clots  or masses were appreciated.   The interventricular septum and posterior wall were thickened and  measured 1.7 cm and 1.4 cm respectively.   The aortic valve is trileaflet and pliable.  It is minimally thickened  but with normal leaflet opening.  No aortic insufficiency is noted.  No  aortic stenosis is noted.  Doppler interrogation revealed aortic valve  within normal limits.   The mitral valve appears mildly thickened but without limitation to  leaflet excursion.  No mitral valve prolapse is noted.  Mild-to-moderate  mitral regurgitation is noted.  Doppler interrogation of mitral valve is  within normal limits.   Pulmonic valve appeared grossly structurally normal.   Tricuspid valve appears grossly structurally normal with mild tricuspid  regurgitation noted.   The left ventricle appears subjectively normal in size.  Overall left  ventricular function is normal with no obvious regional wall motion  abnormalities noted.  There does appear to be basal septal hypertrophy  without LV OT obstruction.   The right atrium and right ventricle appear normal in size.  Right  ventricular systolic  function appears normal.   IMPRESSIONS:  1. Moderate left atrial enlargement.  2. Mild-to-moderate concentric left ventricular hypertrophy with      additional basal septal hypertrophy overlay as is common in the      elderly but without obvious left ventricular outflow tract      obstruction noted.  3. Minimal aortic sclerosis without stenosis.  4. Mild thickening of the mitral valve without limitation of leaflet      excursion.  5. Mild-to-moderate mitral regurgitation.  6. Mild tricuspid regurgitation.  7. Normal left size and systolic function without obvious regional      wall motion abnormality noted.  There does not appear to be a      significant change from prior echo.           ______________________________  Dani Gobble, MD     AB/MEDQ  D:  12/14/2006  T:  12/15/2006  Job:  [id number]   cc:   Dorris Singh, DO   [facility]

## 2010-05-28 NOTE — H&P (Signed)
NAMETANESSA, TIDD               ACCOUNT NO.:  [id number]   MEDICAL RECORD NO.:  [id number]          PATIENT TYPE:  OBV   LOCATION:  A213                          FACILITY:  [facility]   PHYSICIAN:  Dorris Singh, DO    DATE OF BIRTH:  10-Feb-1926   DATE OF ADMISSION:  12/10/2006  DATE OF DISCHARGE:  LH                              HISTORY & PHYSICAL   The patient is a 77 year old Caucasian female who presented to the [facility] ED after being witnessed by her family of having some confusion and  then being unable to straighten out her right index finger on her right  hand, also she complained of some right sided weakness that lasted for  over 5 minutes.  The patient has had a previous history of stroke and  family was concerned.  The symptoms resolved.  There was some confusion  following it but since admission to the hospital they have noted that  the confusion has gone.   PAST MEDICAL HISTORY:  1. Leukemia.  2. COPD.  3. Diabetes.  4. Acid reflux.  5. Kidney stones.  6. Stroke.   PAST SURGICAL HISTORY:  1. Cholecystectomy.  2. Hip replacement.  3. Hysterectomy.  4. Knee replacement.  5. Lithotripsy.  6. Duodenal surgery.   She has multiple drug allergies which include:  1. ASPIRIN.  2. AVELOX.  3. MOTRIN.  4. SULFA.  5. PENICILLIN.  6. MORPHINE.  7. CIPRO.  8. VIOXX.  9. PAXIL.  10.LIPITOR.  11.VYTORIN 10/10.  12.MACRODANTIN.   CURRENT MEDICATION LIST:  She currently is not on any.  She has not been  on any medications since June 2007.   SOCIAL HISTORY:  She currently lives with her daughter.  She denies any  drug abuse, does drink alcohol socially, and is a former smoker - quit  several years ago.   REVIEW OF SYSTEMS:  GENERAL:  Negative for weight loss.  Positive for  weakness and fatigue lasting for a few minutes.  EYES:  Negative for  changes in vision or diplopia.  ENMT:  Negative for ear pain, hearing  loss, hoarseness, sinus pressure, or sore throat.   CARDIOVASCULAR:  Negative for chest pain, palpitations, bradycardia, claudication.  RESPIRATORY:  Negative for cough, dyspnea, wheezing, or tachypnea.  GASTROINTESTINAL:  Negative for nausea, vomiting, or diarrhea.  GU:  Negative for dysuria, urgency, or dribbling.  MUSCULOSKELETAL:  Positive  for arthritis but negative for neck pain or myalgias.  SKIN:  Negative  for pruritus, rash, or abrasions.  NEUROLOGIC:  Positive for confusion.  HEMATOLOGIC:  Negative for anemia but positive for a chronic  leukocytosis.   PHYSICAL EXAMINATION:  VITAL SIGNS:  As follows, blood pressure 144/62,  pulse rate 65, respirations 18.  GENERAL:  This is a well developed, well nourished, 77 year old,  Caucasian female who is in no acute distress.  HEAD:  Normocephalic, atraumatic.  EYES:  EOMI.  PERRLA.  ENMT:  Ears:  TMs visualized bilaterally.  Throat:  Teeth are in poor  repair.  No erythema or exudate.  NECK:  Supple.  No thyromegaly.  No lymphadenopathy.  Nontender.  CARDIOVASCULAR:  Regular rate and rhythm.  S1 S2 present.  No murmurs,  rubs, or gallops.  RESPIRATORY:  Clear to auscultation bilaterally.  No rales, wheezes, or  rhonchi.  CHEST:  Symmetrical with movements.  ABDOMEN:  Soft, nontender.  No masses or hepatosplenomegaly.  No  guarding or rebound.  EXTREMITIES:  Her right lower extremity is larger than the left.  She  has a scar for status post knee replacement.  There is no edema.  NEUROLOGIC:  Cranial nerves II-XII grossly intact.  Sensation is grossly  intact.  SKIN:  Good color.  Good turgor.  No abrasions noted.   She had an EKG which showed a heart rate of 58 with sinus brady,  nonspecific ST-T wave changes which was unchanged from her last EKG on  December 10, 2006.  She had a CT of the head which demonstrated no acute  changes.  Her cardiac markers were within normal limits.  Her BMP was  within normal limits except for the exception of a glucose which was  elevated at 202.   A CBC, her white count was 15.6, hemoglobin 13.3,  hematocrit 38.3, and platelet count 153.   ASSESSMENT:  1. Transient ischemic attack, rule out cerebrovascular accident.  2. History of hypertension.  3. Hyperglycemia.  4. Leukemia.   PLAN:  1. We will admit the patient to 23-hour observation for continual      neuro checks.  2. We will put the patient's multiple allergies on chart.  3. We will go ahead and order a 2D echo if one has not been done in      the last 6 months and bilateral carotid Doppler if one has not been      done.  4. Speech, PT, and OT consults in the a.m.  5. We will consult Dr. Gerilyn Pilgrim as well.  6. We will put the patient on home medications.  7. We will do DVT and GI prophylaxis.  8. I spoke with the patient today in interview and family.  The      patient is concerned about her multiple allergies and would prefer      any blood pressure medication that she is started on she actually      gets from her primary care physician just because they know her and      she is just concerned of having a reaction and the son and the      daughter were in the room there and also stated that concern.  I      talked with Dr. Phillips Odor tonight and presented the patient's      situation to him.  They have office hours on the 28th.  He stated      that she can just walk in, either the 28th or the 29th to be seen      and he would be happy to institute any therapies.  I also explained      to him that I would go ahead and get as much testing done as I can      while she is here, depending on availability of techs over the      holiday weekend and we will institute that.  We will discuss this      with the patient and if no testing is available, we will go ahead      and discharge her in the morning to follow up with him immediately  for her to go from the hospital to his office but if some testing      can be done we will have her discharged after that and then he can       follow up on the results from the office.      Dorris Singh, DO  Electronically Signed     CB/MEDQ  D:  12/10/2006  T:  12/10/2006  Job:  [id number]   cc:   Dr. Phillips Odor

## 2010-05-28 NOTE — Assessment & Plan Note (Signed)
[facility]                         GASTROENTEROLOGY OFFICE NOTE   NAME:Tarbet, EARLENE BJELLAND                      MRN:          [id number]  DATE:12/24/2006                            DOB:          10/12/1926    Ms. Eriksson is a delightful 77 year old white female who is here to  discuss colorectal screening.  We saw Ms. Treaster in the past, she had a  perforated duodenal ulcer in 1999 and then subsequently in June 2004  underwent upper endoscopy and colonoscopy with findings of a tubular  adenoma of the left and right colon.  She also had small hemorrhoids.  Her initial recall interval was set for 3 years which would have been in  June 2007, but since then the guidelines have changed and she is now on  a 5 year recall plan, so her next colonoscopy is scheduled for June  2009.  She is having mild constipation, no abdominal pain.  She denies  any rectal bleeding.  There is no family history of colon cancer.  Patient had a TIA several weeks ago and was put on Plavix 75 mg a day.   MEDICATIONS:  1. Plavix 75 mg p.o. daily.  2. Detrol LA.  3. Doxycycline 100 mg p.o. b.i.d.  4. Prilosec OTC daily.   PAST HISTORY:  1. Diabetes x2 years.  2. TIA.  3. She has kidney stones.  4. Question of leukemia.  5. Kidney disease.  6. Duodenal ulcer.   OPERATIONS:  1. Cholecystectomy.  2. Colonoscopy.  3. Hysterectomy.  4. Appendectomy.  5. Breast surgery.   FAMILY HISTORY:  Positive for diabetes in her father.   SOCIAL HISTORY:  She has 3 children and has been retired as a Oncologist.  She does not smoke, drinks alcohol only socially.   REVIEW OF SYSTEMS:  Positive for swelling of her feet.   PHYSICAL EXAMINATION:  Blood pressure 120/60, pulse 60 and weight 174  pounds.  She was alert, oriented and in no distress.  She has extensive  ecchymosis in the dorsal aspects of her hands secondary to Plavix.  She  also had a tinea infection of the  abdominal wall right over the  umbilicalis with some excoriations and dryness of the skin.  ABDOMEN:  Soft with tenderness in the right middle quadrant, normoactive  bowel sounds, no distention.  RECTAL:  Soft Hemoccult negative stool.  LUNGS:  Clear to auscultation.  COR:  Normal S1 and normal S2.   IMPRESSION:  51. A 77 year old white female who has mild functional constipation.      She is due for repeat recall colonoscopy in June 2009.  2. Anticoagulation with Plavix for transient ischemic attack, patient      ought to stay on Plavix for her colonoscopy.  She will receive a      recall letter in May 2009.  3. Continue Prilosec, there is no indication that she has any      recurrent ulcer at this time.  If she develops any symptoms      suggestive of recurrent peptic ulcer we may do  the upper endoscopy      together with the colonoscopy.     Hedwig Morton. Juanda Chance, MD  Electronically Signed    DMB/MedQ  DD: 12/24/2006  DT: 12/24/2006  Job #: [id number]   cc:   ____ Holland Commons, M.D.

## 2010-05-30 ENCOUNTER — Ambulatory Visit (HOSPITAL_COMMUNITY)
Admission: RE | Admit: 2010-05-30 | Discharge: 2010-05-30 | Disposition: A | Discharge status: Home / Self Care | Payer: PRIVATE HEALTH INSURANCE | Source: Ambulatory Visit | Attending: Family Medicine | Admitting: Family Medicine

## 2010-05-30 ENCOUNTER — Other Ambulatory Visit (HOSPITAL_COMMUNITY): Payer: Self-pay | Admitting: Family Medicine

## 2010-05-30 DIAGNOSIS — M7989 Other specified soft tissue disorders: Secondary | ICD-10-CM | POA: Insufficient documentation

## 2010-05-31 NOTE — Discharge Summary (Signed)
   NAMESHAIANN, [name]                         ACCOUNT NO.:  [id number]   MEDICAL RECORD NO.:  [id number]                   PATIENT TYPE:  INP   LOCATION:  A313                                 FACILITY:  [facility]   PHYSICIAN:  Madelin Rear. Sherwood Gambler, M.D.             DATE OF BIRTH:  April 25, 1926   DATE OF ADMISSION:  01/05/2002  DATE OF DISCHARGE:  01/08/2002                                 DISCHARGE SUMMARY   DISCHARGE DIAGNOSES:  1. Influenza with pneumonitis.  2. Hypovolemia/dehydration.  3. Chronic lymphoid leukemia.  4. Diabetes mellitus type 2.   DISCHARGE MEDICATIONS:  1. Levaquin 500 mg p.o. daily.  2. Resumption of Amaryl, Lipitor, Macrodantin, and vitamin E.   SUMMARY/HOSPITAL COURSE:  This 77 year old white female presented with a 12-  hour history of chills, polymyalgia, polyarthralgia, and cough with some  blood speckled sputum.  She is status post total knee replacement and total  hip replacement, peptic ulcer disease, CLL known, diabetes mellitus, COPD,  and degenerative joint disease.  She was found to have clinical pneumonia;  was brought in and placed on IV antibiotics, hydration, and responded well  to intervention.  On the day of discharge she was stable and afebrile.  Follow up in office.                                               Madelin Rear. Sherwood Gambler, M.D.    LJF/MEDQ  D:  02/06/2002  T:  02/06/2002  Job:  [id number]

## 2010-05-31 NOTE — Procedures (Signed)
NAME:  [name], [name]                         ACCOUNT NO.:  [id number]   MEDICAL RECORD NO.:  [id number]                   PATIENT TYPE:  OUT   LOCATION:  RAD                                  FACILITY:  [facility]   PHYSICIAN:  Dani Gobble, MD                    DATE OF BIRTH:  01-24-1926   DATE OF PROCEDURE:  DATE OF DISCHARGE:                                  ECHOCARDIOGRAM   INDICATIONS:  [name] is a 77 year old female who has been experiencing  dyspnea.   The technical quality of this study is adequate.   The aorta is within normal limits at 3.2 cm.  The left atrium is mild to  moderately dilated at 4.7 cm.  The patient appeared to be in sinus rhythm  during this procedure, and no obvious clots or masses were appreciated.   The intraventricular septum and posterior wall were moderately thickened.  The aortic valve was trileaflet and mildly thickened with minimal  calcification but was on reasonable leaflet excursion.  No significant  aortic insufficiency is noted.  Doppler interrogation of the aortic valve is  within normal limits.   The mitral valve also appears mildly thickened with minimal calcification  but with reasonable leaflet excursion.  No mitral valve prolapse was noted.  Mild mitral annular calcification was noted.  Trivial mitral regurgitation  was noted.  Doppler interrogation of the mitral valve was within normal  limits.   The pulmonic valve was incompletely visualized but appeared to be grossly  and structurally normal.   The tricuspid valve also appeared to be grossly and structurally normal with  mild tricuspid regurgitation noted.   The left ventricle was normal in size with normal left ventricular systolic  function.  No regional wall motion abnormalities were noted.  The presence  of diastolic dysfunction was inferred from the pulse wave across the mitral  valve.  The right atrium and right ventricle appeared to be reasonably  normal in size with  normal right ventricular systolic function.   IMPRESSION:  1. Mild-to-moderate left atrial enlargement.  2. Moderate concentric left ventricular hypertrophy with additional basal     septal hypertrophy overload, as is common in the elderly.  3. Trileaflet aortic valve with reasonable leaflet excursion and minimal     calcification.  4. Mildly thickened with minimal calcification of the mitral valve with     reasonable leaflet excursion.  5. Mild mitral annular calcification.  6. Trivial mitral regurgitation.  7. Mild tricuspid regurgitation.  8. Normal left ventricular size and systolic function without regional wall     motion abnormalities noted.  9. The presence of diastolic dysfunction easily inferred from pulse wave     across the mitral valve.      ___________________________________________  Dani Gobble, MD   AB/MEDQ  D:  03/02/2003  T:  03/02/2003  Job:  [id number]   cc:   Corrie Mckusick, M.D.  [address]  [place]  [place] [address]  Fax: [phone]

## 2010-05-31 NOTE — H&P (Signed)
NAME:  [name], [name]                           ACCOUNT NO.:  [id number]   MEDICAL RECORD NO.:  [id number]                   PATIENT TYPE:  PINP   LOCATION:                                       FACILITY:  [facility]   PHYSICIAN:  Hanley Hays. Dechurch, M.D.           DATE OF BIRTH:  [birth year]   DATE OF ADMISSION:  01/05/2002  DATE OF DISCHARGE:                                HISTORY & PHYSICAL   HISTORY OF PRESENT ILLNESS:  The patient is a 77 year old Caucasian female  __________ who presents with 12-hour history of sudden-onset chills,  myalgias, arthralgias, described as severe,  no fever per se but has a dry  cough with some blood-speckled sputum.  She has a grandson and daughter who  were sick recently with similar symptoms.  She has noted her glucose has  been higher over the past 24 hours.  The patient did not have a flu shot  this year that has made her sick to the point of being hospitalized in the  past.  She has a history of ileus, no headache.  She denies any diarrhea,  positive poor appetite, some mild nausea but no vomiting.  She takes  Macrodantin on chronic basis for recurrent UTIs and has not had one in  several years.  She does have a history of nephrolithiasis but currently her  pain is no consistent with that.  The chest x-ray shows some chronic  bronchitic changes but no acute infiltrate.  There may be some fluid in the  minor fissure.  Her white count was 37,000 with normal differential and  apparently she does carry a diagnosis of CLL though I have no recent  laboratories to compare with.  She is being admitted for evaluation and  monitoring as the patient is not able to get about unassisted and lives  alone.   PAST MEDICAL HISTORY:  Remarkable for right TKR and left THR, status post  ruptured duodenal ulcer in 1998, history of total abdominal hysterectomy  secondary to abnormal pap in 1974, history of nephrolithiasis and  lithotripsy x3, cholecystectomy,  appendectomy, ovarian cyst.  She has had an  anesthesia reaction, although she does not know to what, and CLL.  She also  has diabetes mellitus, COPD, degenerative joint disease.  Also  cephalosporin/penicillin anaphylactic reaction.   ALLERGIES:  CEPHALOSPORIN/PENICILLIN, MORPHINE has caused hallucinations in  the past, and CODEINE apparently caused some sort effect.   CURRENT MEDICATIONS:  Amaryl 4 mg b.i.d., Lipitor 10, Macrodantin 50 daily,  and vitamin E 400 mg.   SOCIAL HISTORY:  She is widowed x9-1/2 years.  She has 2 children who are  alive and well.  No alcohol use.  She quit smoking in June of this year.   FAMILY HISTORY:  Pertinent for diabetes.   LABORATORY DATA:  White count 37,000 with 66 neutrophils, 30 lymphs, no  monos.  Atypical lymphocytes and toxic  granulation are noted on the smears  of smudged cells.  D-Dimer for some reason is 0.25.  PO2 71, pCO2 29, pH  7.47 on room air.  Glucose 269, BUN 24, creatinine 1.2, calcium 8.8, sodium  132.  ________ negative at 40.3.   REVIEW OF SYSTEMS:  The patient recently started Lipitor about 1 month prior  and is normally independent, lives alone.   PHYSICAL EXAMINATION:  Slightly obese white female in no acute distress.  Blood pressure was 115/53, pulse initially was 100, respirations unlabored,  temperature 97.4.  Mentation is intact.  Nonfocal neuro exam.  HEENT:  Pertinent for bilateral conjunctivitis.  Oropharynx is moist, no  lesions.  NECK:  Supple.  There is some adenopathy in the left subclavian area which  is mobile and nontender.  No other masses are noted.  LUNGS:  Clear, anterior and posterior without bronchi or rales.  HEART:  Regular, no murmur.  ABDOMEN:  Obese, soft and diffusely tender though no guarding or rebound is  noted.  EXTREMITIES:  Without clubbing, cyanosis or edema.  SKIN:  Diffusely dry.  Pulses are intact.  Skin reveals no rash, lesion or  breakdown otherwise.  NEUROLOGIC EXAM:  Intact.    ASSESSMENT AND PLAN:  1. Probable viral syndrome with mild dehydration.  The patient lives alone     and cannot get around without assistance.  She will be admitted for     observation.  2. Chronic lymphocytic leukemia with a normal differential.  Doubt there is     superimposed sepsis here and this was discussed with the family.     Certainly monitoring would be reasonable and comparing with previous.     There is no intervention at this time.  I am not going to use antibiotics     given her history of multiple drug reactions and the fact that it is not     at all indicated at this stage.  It should be noted her UA is normal.  3. Hemophthisis.  The family is quite concerned about this.  There are no     frank lesions on her chest x-ray which reveals the bronchitic changes.     Certainly this will need followup if it persists and this was     communicated to the family.  Given the fact that the patient has diffuse     myalgias, I am going to check a CPK given recent institution of Lipitor     and a liver profile as well, though I do not believe this is playing a     role, given the acuity of the onset.  4. Diabetes mellitus.  Sliding scale insulin, regular medication.  At this     point, monitor for hypoglycemia.  Again, the plan of care was discussed     with the family.                                               Hanley Hays Josefine Class, M.D.    FED/MEDQ  D:  01/05/2002  T:  01/06/2002  Job:  [id number]

## 2010-05-31 NOTE — Procedures (Signed)
NAMERENESHIA, [name]                           ACCOUNT NO.:  [id number]   MEDICAL RECORD NO.:  [id number]                  PATIENT TYPE:   LOCATION:                                       FACILITY:   PHYSICIAN:  Edward L. Juanetta Gosling, M.D.             DATE OF BIRTH:   DATE OF PROCEDURE:  DATE OF DISCHARGE:                              PULMONARY FUNCTION TEST   FINDINGS:  1. Spirometry shows a mild ventilatory defect with little evidence of air-     flow obstruction.  2. Lung volumes show a moderate restrictive change.  3. DLCO is severely reduced.      ___________________________________________                                            Oneal Deputy. Juanetta Gosling, M.D.   ELH/MEDQ  D:  03/21/2003  T:  03/21/2003  Job:  [id number]   cc:   [facility]

## 2010-07-01 ENCOUNTER — Encounter (HOSPITAL_COMMUNITY): Payer: Self-pay

## 2010-07-10 ENCOUNTER — Other Ambulatory Visit (HOSPITAL_COMMUNITY): Payer: Self-pay | Admitting: Oncology

## 2010-07-10 DIAGNOSIS — C9110 Chronic lymphocytic leukemia of B-cell type not having achieved remission: Secondary | ICD-10-CM

## 2010-07-26 ENCOUNTER — Other Ambulatory Visit (HOSPITAL_COMMUNITY): Payer: Self-pay

## 2010-07-29 ENCOUNTER — Ambulatory Visit (HOSPITAL_COMMUNITY): Payer: Self-pay | Admitting: Oncology

## 2010-08-02 ENCOUNTER — Encounter (HOSPITAL_COMMUNITY): Payer: PRIVATE HEALTH INSURANCE | Attending: Oncology

## 2010-08-02 DIAGNOSIS — D696 Thrombocytopenia, unspecified: Secondary | ICD-10-CM | POA: Insufficient documentation

## 2010-08-02 DIAGNOSIS — C9110 Chronic lymphocytic leukemia of B-cell type not having achieved remission: Secondary | ICD-10-CM

## 2010-08-02 LAB — COMPREHENSIVE METABOLIC PANEL
AST: 17 U/L (ref 0–37)
BUN: 23 mg/dL (ref 6–23)
CO2: 25 mEq/L (ref 19–32)
Chloride: 106 mEq/L (ref 96–112)
Creatinine, Ser: 1.02 mg/dL (ref 0.50–1.10)
GFR calc Af Amer: >60 mL/min (ref >60)
GFR calc non Af Amer: 52 mL/min — ABNORMAL LOW (ref >60)
Glucose, Bld: 108 mg/dL — ABNORMAL HIGH (ref 70–99)
Total Bilirubin: 0.4 mg/dL (ref 0.3–1.2)

## 2010-08-02 LAB — CBC
HCT: 37.5 % (ref 36.0–46.0)
Hemoglobin: 12.7 g/dL (ref 12.0–15.0)
MCV: 96.4 fL (ref 78.0–100.0)
Platelets: 136 10*3/uL — ABNORMAL LOW (ref 150–400)
RBC: 3.89 MIL/uL (ref 3.87–5.11)
WBC: 16.1 10*3/uL — ABNORMAL HIGH (ref 4.0–10.5)

## 2010-08-02 LAB — DIFFERENTIAL
Basophils Absolute: 0 10*3/uL (ref 0.0–0.1)
Basophils Relative: 0 % (ref 0–1)
Monocytes Absolute: 0.3 10*3/uL (ref 0.1–1.0)
Neutro Abs: 4.4 10*3/uL (ref 1.7–7.7)

## 2010-08-02 NOTE — Progress Notes (Signed)
Labs drawn today for cbc/diff and cmp

## 2010-08-05 ENCOUNTER — Encounter (HOSPITAL_BASED_OUTPATIENT_CLINIC_OR_DEPARTMENT_OTHER): Payer: PRIVATE HEALTH INSURANCE | Admitting: Oncology

## 2010-08-05 ENCOUNTER — Encounter (HOSPITAL_COMMUNITY): Payer: Self-pay | Admitting: Oncology

## 2010-08-05 VITALS — BP 151/72 | HR 67 | Temp 98.0°F | Wt 163.2 lb

## 2010-08-05 DIAGNOSIS — D696 Thrombocytopenia, unspecified: Secondary | ICD-10-CM

## 2010-08-05 DIAGNOSIS — C9110 Chronic lymphocytic leukemia of B-cell type not having achieved remission: Secondary | ICD-10-CM

## 2010-08-05 NOTE — Progress Notes (Signed)
Carla Ribas, MD [address] [place] [address]  1. CLL  CBC, Differential, Comprehensive metabolic panel    CURRENT THERAPY: None  INTERVAL HISTORY: [name] 77 y.o. female returns for followup of CLL Stage 0.  She has not required any therapy.  The patient denies any complaints today.  She reports that she has had both hips and her right knee replaced.  She is seen utilizing a cane for balance and ambulation.  She denies any recent falls.  She reports that she feels well.  She denies noticing any lymphadenopathy and B-symptomatolgy.  The patient is pleased to hear the results of her lab work which illustrates a WBC of 16.1, platelet count of 136, and an absolute lymphocyte count of 11.3.    Past Medical History  Diagnosis Date  . Diabetes mellitus   . Pneumonia     history  . Hypothyroidism   . Kidney stones     histroy  . Hx: UTI (urinary tract infection)   . CVA (cerebral infarction) 09/2009  . Stroke     has CLL; POLYP, COLON; DIABETES INSIPIDUS; HYPERLIPIDEMIA; CVA; TIA; COPD; DUODENAL ULCER; GASTRITIS, ACUTE; RENAL CALCULUS; and DYSPNEA on her problem list.     is allergic to advil; avelox; ciprofloxacin; codeine; ibuprofen; keflex; lipitor; macrobid; macrodantin; morphine sulfate; paxil; penicillins; salicylic acid; sulfa antibiotics; and vioxx.  Ms. Koren does not currently have medications on file.  Past Surgical History  Procedure Date  . Cardiac catheterization   . Lithotripsy   . Breast lumpectomy     left  breast  . Replacement total knee     right  . Appendectomy   . Ovarian cyst removal   . Total hip arthroplasty     left  . Abdominal hysterectomy   . Bartholin gland cyst excision   . Total hip arthroplasty     right    Denies any headaches, dizziness, double vision, fevers, chills, night sweats, nausea, vomiting, diarrhea, constipation, chest pain, heart palpitations, shortness of breath, blood in stool,  black tarry stool, urinary pain, urinary burning, urinary frequency, hematuria.   PHYSICAL EXAMINATION  ECOG PERFORMANCE STATUS: 0 - Asymptomatic  Filed Vitals:   08/05/10 1047  BP: 151/72  Pulse: 67  Temp: 98 F (36.7 C)    GENERAL:alert, no distress, well nourished, well developed, comfortable and cooperative SKIN: skin color, texture, turgor are normal, no rashes or significant lesions HEAD: Normocephalic EYES: normal EARS: External ears normal OROPHARYNX:not examined  NECK: supple, no adenopathy, no bruits, no JVD, thyroid normal size, non-tender, without nodularity, no stridor, non-tender, trachea midline LYMPH:  no palpable lymphadenopathy, no hepatosplenomegaly BREAST:not examined LUNGS: clear to auscultation and percussion HEART: regular rate & rhythm, no murmurs, no gallops, S1 normal and S2 normal ABDOMEN:abdomen soft, non-tender, normal bowel sounds, no masses or organomegaly and no hepatosplenomegaly BACK: Back symmetric, no curvature., No CVA tenderness EXTREMITIES:less then 2 second capillary refill, no joint deformities, effusion, or inflammation, no edema, no skin discoloration, no clubbing, no cyanosis  NEURO: alert & oriented x 3 with fluent speech, no focal motor/sensory deficits, gait normal     LABORATORY DATA: Lab Results  Component Value Date   WBC 16.1* 08/02/2010   HGB 12.7 08/02/2010   HCT 37.5 08/02/2010   MCV 96.4 08/02/2010   PLT 136* 08/02/2010     Chemistry      Component Value Date/Time   NA 140 08/02/2010 1011   K 4.8 08/02/2010 1011  CL 106 08/02/2010 1011   CO2 25 08/02/2010 1011   BUN 23 08/02/2010 1011   CREATININE 1.02 08/02/2010 1011      Component Value Date/Time   CALCIUM 9.3 08/02/2010 1011   ALKPHOS 51 08/02/2010 1011   AST 17 08/02/2010 1011   ALT 10 08/02/2010 1011   BILITOT 0.4 08/02/2010 1011           ASSESSMENT:  1. Stage 0, CLL not on therapy.  No clinical lymphadenopathy noted 2. Thrombocytopenia   PLAN:  1.  CBC with Diff, and CMET in 6 months time 2. Return in 6 months to see MD for follow-up.   All questions were answered. The patient knows to call the clinic with any problems, questions or concerns. We can certainly see the patient much sooner if necessary.  The patient and plan discussed with Glenford Peers, MD and he is in agreement with the aforementioned.  I spent 15 minutes counseling the patient face to face. The total time spent in the appointment was 30 minutes.  Avraj Lindroth

## 2010-10-22 LAB — BASIC METABOLIC PANEL
BUN: 13
CO2: 28
Calcium: 8.6
Calcium: 9
Chloride: 108
GFR calc Af Amer: >60
GFR calc non Af Amer: 52 — ABNORMAL LOW
Glucose, Bld: 179 — ABNORMAL HIGH
Glucose, Bld: 202 — ABNORMAL HIGH
Sodium: 140
Sodium: 141

## 2010-10-22 LAB — URINALYSIS, ROUTINE W REFLEX MICROSCOPIC
Bilirubin Urine: NEGATIVE
Specific Gravity, Urine: 1.02
Urobilinogen, UA: 0.2
pH: 6

## 2010-10-22 LAB — DIFFERENTIAL
Band Neutrophils: 1
Basophils Absolute: 0
Basophils Absolute: 0.1
Basophils Relative: 0
Eosinophils Absolute: 0.3
Eosinophils Relative: 2
Lymphocytes Relative: 67 — ABNORMAL HIGH
Lymphs Abs: 10.4 — ABNORMAL HIGH
Metamyelocytes Relative: 2
Myelocytes: 0
Neutro Abs: 4.5
Neutrophils Relative %: 29 — ABNORMAL LOW
Promyelocytes Absolute: 0

## 2010-10-22 LAB — URINE CULTURE: Colony Count: >=100000

## 2010-10-22 LAB — URINE MICROSCOPIC-ADD ON

## 2010-10-22 LAB — CBC
Hemoglobin: 12.8
Hemoglobin: 13.3
MCHC: 34.6
MCV: 94.1
Platelets: 153
RBC: 3.92
RDW: 13.1
RDW: 13.2

## 2010-10-22 LAB — PROTIME-INR
INR: 1
Prothrombin Time: 13.6

## 2010-10-22 LAB — HEMOGLOBIN A1C
Hgb A1c MFr Bld: 7 — ABNORMAL HIGH
Mean Plasma Glucose: 172

## 2010-10-22 LAB — APTT: aPTT: 27

## 2010-10-22 LAB — SEDIMENTATION RATE: Sed Rate: 8

## 2011-01-31 ENCOUNTER — Other Ambulatory Visit (HOSPITAL_COMMUNITY): Payer: PRIVATE HEALTH INSURANCE

## 2011-02-03 ENCOUNTER — Ambulatory Visit (HOSPITAL_COMMUNITY): Payer: PRIVATE HEALTH INSURANCE | Admitting: Oncology

## 2011-02-10 ENCOUNTER — Other Ambulatory Visit (HOSPITAL_COMMUNITY): Payer: Self-pay

## 2011-02-14 ENCOUNTER — Ambulatory Visit (HOSPITAL_COMMUNITY): Payer: Self-pay | Admitting: Oncology

## 2011-04-04 ENCOUNTER — Ambulatory Visit (HOSPITAL_COMMUNITY)
Admission: RE | Admit: 2011-04-04 | Discharge: 2011-04-04 | Disposition: A | Discharge status: Home / Self Care | Payer: Medicare Other | Source: Ambulatory Visit | Attending: Family Medicine | Admitting: Family Medicine

## 2011-04-04 ENCOUNTER — Other Ambulatory Visit (HOSPITAL_COMMUNITY): Payer: Self-pay | Admitting: Family Medicine

## 2011-04-04 DIAGNOSIS — M25559 Pain in unspecified hip: Secondary | ICD-10-CM | POA: Insufficient documentation

## 2011-04-04 DIAGNOSIS — M169 Osteoarthritis of hip, unspecified: Secondary | ICD-10-CM

## 2011-04-04 DIAGNOSIS — Z96649 Presence of unspecified artificial hip joint: Secondary | ICD-10-CM | POA: Insufficient documentation

## 2011-04-22 ENCOUNTER — Ambulatory Visit (HOSPITAL_COMMUNITY)
Admission: RE | Admit: 2011-04-22 | Discharge: 2011-04-22 | Disposition: A | Discharge status: Home / Self Care | Payer: Medicare Other | Source: Ambulatory Visit | Attending: Family Medicine | Admitting: Family Medicine

## 2011-04-22 ENCOUNTER — Other Ambulatory Visit (HOSPITAL_COMMUNITY): Payer: Self-pay | Admitting: Family Medicine

## 2011-04-22 DIAGNOSIS — M25519 Pain in unspecified shoulder: Secondary | ICD-10-CM

## 2011-04-22 DIAGNOSIS — M898X9 Other specified disorders of bone, unspecified site: Secondary | ICD-10-CM | POA: Insufficient documentation

## 2011-04-22 DIAGNOSIS — M7150 Other bursitis, not elsewhere classified, unspecified site: Secondary | ICD-10-CM

## 2011-07-02 ENCOUNTER — Ambulatory Visit (INDEPENDENT_AMBULATORY_CARE_PROVIDER_SITE_OTHER): Payer: Medicare Other | Admitting: Orthopedic Surgery

## 2011-07-02 ENCOUNTER — Encounter: Payer: Self-pay | Admitting: Orthopedic Surgery

## 2011-07-02 VITALS — BP 100/64 | Ht 64.25 in | Wt 168.0 lb

## 2011-07-02 DIAGNOSIS — M549 Dorsalgia, unspecified: Secondary | ICD-10-CM

## 2011-07-02 DIAGNOSIS — Z96649 Presence of unspecified artificial hip joint: Secondary | ICD-10-CM

## 2011-07-02 DIAGNOSIS — Z96643 Presence of artificial hip joint, bilateral: Secondary | ICD-10-CM

## 2011-07-02 NOTE — Patient Instructions (Addendum)
GO TO [facility] AND GET BACK XRAYS THEN WE WILL CALL YOU WITH THE RESULTS   SEE YOUR DOCTOR FOR REFERRAL TO BACK SPECIALIST

## 2011-07-02 NOTE — Progress Notes (Signed)
  Subjective:    [name] is a 78 y.o. female who presents at the request of Dr. Phillips Odor for evaluation of RIGHT lower extremity pain, status post bilateral hip replacements and RIGHT total knee arthroplasty. She complains of pain in the RIGHT thigh starting in the RIGHT iliac crest radiating to the RIGHT knee. She describes throbbing, burning, 8/10 pain, which tends to come and go it is better with rest, worse with activity.  X-rays already been done, it was normal, including both hips. X-ray was reviewed. I agree both hips are normal. In terms of replacement.  She does use a cane occasionally. Her only complaint and review of systems was fatigue.  The following portions of the patient's history were reviewed and updated as appropriate: allergies, current medications, past family history, past medical history, past social history, past surgical history and problem list.   Review of Systems Pertinent items are noted in HPI.   Objective:    BP 100/64  Ht 5' 4.25" (1.632 m)  Wt 168 lb (76.204 kg)  BMI 28.61 kg/m2  Physical Exam(12) GENERAL: normal development   CDV: pulses are normal   Skin: normal  Lymph: nodes were not palpable/normal  Psychiatric: awake, alert and oriented  Neuro: normal sensation   Right hip: full painless range of motion, without tenderness  Left hip: full painless range of motion, without tenderness  LEG LENGTHS ARE EQUAL  STRENGTH NORMAL  NO DISLOCATIONS  Imaging:   Assessment:    s/p BILATERAL THA   BACK PAIN    Plan:    XRAYS OF BACK AT [facility]  REFER TO PMD FOR REFERRAL TO BACK SPECIALIST  HIP NORMAL

## 2011-07-24 ENCOUNTER — Other Ambulatory Visit: Payer: Self-pay | Admitting: Orthopedic Surgery

## 2011-07-24 ENCOUNTER — Ambulatory Visit (HOSPITAL_COMMUNITY)
Admission: RE | Admit: 2011-07-24 | Discharge: 2011-07-24 | Disposition: A | Discharge status: Home / Self Care | Payer: Medicare Other | Source: Ambulatory Visit | Attending: Orthopedic Surgery | Admitting: Orthopedic Surgery

## 2011-07-24 DIAGNOSIS — M549 Dorsalgia, unspecified: Secondary | ICD-10-CM

## 2011-07-24 DIAGNOSIS — M5137 Other intervertebral disc degeneration, lumbosacral region: Secondary | ICD-10-CM | POA: Insufficient documentation

## 2011-07-24 DIAGNOSIS — M79609 Pain in unspecified limb: Secondary | ICD-10-CM | POA: Insufficient documentation

## 2011-07-24 DIAGNOSIS — M5410 Radiculopathy, site unspecified: Secondary | ICD-10-CM

## 2011-07-24 DIAGNOSIS — M545 Low back pain, unspecified: Secondary | ICD-10-CM | POA: Insufficient documentation

## 2011-07-24 DIAGNOSIS — M51379 Other intervertebral disc degeneration, lumbosacral region without mention of lumbar back pain or lower extremity pain: Secondary | ICD-10-CM | POA: Insufficient documentation

## 2011-08-08 ENCOUNTER — Telehealth: Payer: Self-pay | Admitting: Orthopedic Surgery

## 2011-08-08 NOTE — Telephone Encounter (Signed)
Call received from patient, states needs results of Xrays done at [facility], on 07/24/11. Please call at either ph# [phone] [facility])  [phone] [facility]).

## 2011-09-01 NOTE — Telephone Encounter (Signed)
multilevel degenerative disc disease with facet hypertrophy.  Based on the results of the x-rays. She has arthritis of the back

## 2011-09-01 NOTE — Telephone Encounter (Signed)
Patient called back, states she has not yet heard back from Dr. Romeo Apple regarding Xrays of her back done at [facility] on 07/24/11.  Please call at either home # [phone] or cell # [phone].

## 2011-09-02 NOTE — Telephone Encounter (Signed)
Patient aware.

## 2012-04-13 HISTORY — PX: OTHER SURGICAL HISTORY: SHX169

## 2012-04-20 ENCOUNTER — Other Ambulatory Visit (HOSPITAL_COMMUNITY): Payer: Self-pay | Admitting: Family Medicine

## 2012-04-20 DIAGNOSIS — N63 Unspecified lump in unspecified breast: Secondary | ICD-10-CM

## 2012-04-28 ENCOUNTER — Other Ambulatory Visit (HOSPITAL_COMMUNITY): Payer: Self-pay | Admitting: Family Medicine

## 2012-04-28 ENCOUNTER — Ambulatory Visit (HOSPITAL_COMMUNITY)
Admission: RE | Admit: 2012-04-28 | Discharge: 2012-04-28 | Disposition: A | Discharge status: Home / Self Care | Payer: Medicare Other | Source: Ambulatory Visit | Attending: Family Medicine | Admitting: Family Medicine

## 2012-04-28 DIAGNOSIS — N63 Unspecified lump in unspecified breast: Secondary | ICD-10-CM

## 2012-04-28 DIAGNOSIS — N6320 Unspecified lump in the left breast, unspecified quadrant: Secondary | ICD-10-CM

## 2012-05-05 ENCOUNTER — Other Ambulatory Visit (HOSPITAL_COMMUNITY): Payer: Self-pay | Admitting: Family Medicine

## 2012-05-05 ENCOUNTER — Ambulatory Visit (HOSPITAL_COMMUNITY)
Admission: RE | Admit: 2012-05-05 | Discharge: 2012-05-05 | Disposition: A | Discharge status: Home / Self Care | Payer: Medicare Other | Source: Ambulatory Visit | Attending: Family Medicine | Admitting: Family Medicine

## 2012-05-05 ENCOUNTER — Ambulatory Visit (HOSPITAL_COMMUNITY): Payer: Medicare Other

## 2012-05-05 DIAGNOSIS — N6320 Unspecified lump in the left breast, unspecified quadrant: Secondary | ICD-10-CM

## 2012-05-05 DIAGNOSIS — C50919 Malignant neoplasm of unspecified site of unspecified female breast: Secondary | ICD-10-CM

## 2012-05-05 DIAGNOSIS — C50219 Malignant neoplasm of upper-inner quadrant of unspecified female breast: Secondary | ICD-10-CM | POA: Insufficient documentation

## 2012-05-05 HISTORY — DX: Malignant neoplasm of unspecified site of unspecified female breast: C50.919

## 2012-05-05 MED ORDER — LIDOCAINE HCL (PF) 2 % IJ SOLN
INTRAMUSCULAR | Status: AC
  Filled 2012-05-05: qty 10

## 2012-05-05 NOTE — Progress Notes (Signed)
Lidocaine 2%               8mL injected                         Left breast biopsy performed

## 2012-05-13 ENCOUNTER — Encounter (INDEPENDENT_AMBULATORY_CARE_PROVIDER_SITE_OTHER): Payer: Self-pay | Admitting: Surgery

## 2012-05-13 ENCOUNTER — Ambulatory Visit (INDEPENDENT_AMBULATORY_CARE_PROVIDER_SITE_OTHER): Payer: Medicare Other | Admitting: Surgery

## 2012-05-13 ENCOUNTER — Other Ambulatory Visit (INDEPENDENT_AMBULATORY_CARE_PROVIDER_SITE_OTHER): Payer: Self-pay

## 2012-05-13 VITALS — BP 138/66 | HR 62 | Temp 97.7°F | Ht 65.0 in | Wt 179.0 lb

## 2012-05-13 DIAGNOSIS — D72829 Elevated white blood cell count, unspecified: Secondary | ICD-10-CM

## 2012-05-13 DIAGNOSIS — C50919 Malignant neoplasm of unspecified site of unspecified female breast: Secondary | ICD-10-CM

## 2012-05-13 DIAGNOSIS — C50912 Malignant neoplasm of unspecified site of left female breast: Secondary | ICD-10-CM | POA: Insufficient documentation

## 2012-05-13 DIAGNOSIS — D72819 Decreased white blood cell count, unspecified: Secondary | ICD-10-CM

## 2012-05-13 HISTORY — DX: Malignant neoplasm of unspecified site of left female breast: C50.912
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
## 2012-05-19 ENCOUNTER — Encounter: Payer: Self-pay | Admitting: *Deleted

## 2012-05-19 NOTE — Progress Notes (Signed)
Location of Breast Cancer: left, 9 o'clock  Histology per Pathology Report: invasive ductal  Receptor Status: ER(+), PR (-), Her2-neu (-)  Did patient present with symptoms (if so, please note symptoms) or was this found on screening mammography?: palpated, last mammogram 2009  Past/Anticipated interventions by surgeon, if any: none at this time, family unsure about surgery  Past/Anticipated interventions by medical oncology, if any: none noted  Lymphedema issues, if any:  na  Pain issues, if any:  Sudden sharp "nerve" pains in left breast biopsy site  SAFETY ISSUES:  Prior radiation? no  Pacemaker/ICD? no  Possible current pregnancy? no  Is the patient on methotrexate? no  Current Complaints / other details:  Has daughter, son, widowed x 20 yrs, lives at [facility] assisted living. Pt denies loss of appetite, fatigue. Uses walker/chair.

## 2012-05-20 ENCOUNTER — Encounter: Payer: Self-pay | Admitting: Radiation Oncology

## 2012-05-20 ENCOUNTER — Ambulatory Visit
Admission: RE | Admit: 2012-05-20 | Discharge: 2012-05-20 | Disposition: A | Discharge status: Home / Self Care | Payer: Medicare Other | Source: Ambulatory Visit | Attending: Radiation Oncology | Admitting: Radiation Oncology

## 2012-05-20 VITALS — BP 129/56 | HR 66 | Temp 97.4°F | Resp 20 | Wt 182.0 lb

## 2012-05-20 DIAGNOSIS — Z8673 Personal history of transient ischemic attack (TIA), and cerebral infarction without residual deficits: Secondary | ICD-10-CM | POA: Insufficient documentation

## 2012-05-20 DIAGNOSIS — Z79899 Other long term (current) drug therapy: Secondary | ICD-10-CM | POA: Insufficient documentation

## 2012-05-20 DIAGNOSIS — E119 Type 2 diabetes mellitus without complications: Secondary | ICD-10-CM | POA: Insufficient documentation

## 2012-05-20 DIAGNOSIS — J449 Chronic obstructive pulmonary disease, unspecified: Secondary | ICD-10-CM | POA: Insufficient documentation

## 2012-05-20 DIAGNOSIS — E039 Hypothyroidism, unspecified: Secondary | ICD-10-CM | POA: Insufficient documentation

## 2012-05-20 DIAGNOSIS — C50912 Malignant neoplasm of unspecified site of left female breast: Secondary | ICD-10-CM

## 2012-05-20 DIAGNOSIS — Z96659 Presence of unspecified artificial knee joint: Secondary | ICD-10-CM | POA: Insufficient documentation

## 2012-05-20 DIAGNOSIS — J4489 Other specified chronic obstructive pulmonary disease: Secondary | ICD-10-CM | POA: Insufficient documentation

## 2012-05-20 DIAGNOSIS — Z96649 Presence of unspecified artificial hip joint: Secondary | ICD-10-CM | POA: Insufficient documentation

## 2012-05-20 DIAGNOSIS — C50219 Malignant neoplasm of upper-inner quadrant of unspecified female breast: Secondary | ICD-10-CM | POA: Insufficient documentation

## 2012-05-20 DIAGNOSIS — Z170 Estrogen receptor positive status [ER+]: Secondary | ICD-10-CM | POA: Insufficient documentation

## 2012-05-20 HISTORY — DX: Polyneuropathy, unspecified: G62.9

## 2012-05-20 NOTE — Progress Notes (Signed)
Please see the Nurse Progress Note in the MD Initial Consult Encounter for this patient. 

## 2012-05-20 NOTE — Progress Notes (Signed)
[facility] Radiation Oncology NEW PATIENT EVALUATION  Name: [name] MRN: [id number]  Date:   05/20/2012           DOB: 04-14-1926  Status: outpatient   CC: Carla Ribas, MD  Carla Cocking, MD , [name], [name]   REFERRING PHYSICIAN: Kandis Cocking, MD   DIAGNOSIS: Stage IIA (T2 N0 M0) invasive ductal carcinoma of the left breast   HISTORY OF PRESENT ILLNESS:  [name] is a 79 y.o. female who is seen today for the courtesy of [name] for evaluation of her T2 N0 invasive ductal carcinoma of the left breast. She noted a mass along the upper inner quadrant of her left breast this past December. She had not had a mammogram since 2009. She was seen by her primary care physician, [name], and he had her undergo mammography and ultrasonography at [facility] on 04/28/2012. She was found have a spiculated mass within the upper inner quadrant of the left breast measuring 2.5 cm with overlying skin retraction. There was a group of coarse calcifications located centrally within the left breast consistent with a fibroadenoma undergoing involution. Ultrasound showed a 2.5 x 2.4 x 1.9 cm mass at 10:00. There was no axillary adenopathy by ultrasound. A needle core biopsy was diagnostic for invasive ductal carcinoma which was ER positive at 100%, PR negative at 0% with an elevated proliferation Ki-67 of 32%.  PREVIOUS RADIATION THERAPY: No   PAST MEDICAL HISTORY:  has a past medical history of Diabetes mellitus; Pneumonia; Kidney stones; UTI (urinary tract infection); CVA (cerebral infarction) (09/2009); Breast cancer (05/05/12); Leukemia; COPD (chronic obstructive pulmonary disease); Hypothyroidism; Stroke; and Neuropathy.     PAST SURGICAL HISTORY:  Past Surgical History  Procedure Laterality Date  . Cardiac catheterization    . Lithotripsy    . Breast lumpectomy      left  breast  . Replacement total knee      right  . Appendectomy     . Ovarian cyst removal    . Total hip arthroplasty      left  . Bartholin gland cyst excision    . Total hip arthroplasty      right  . Cholecystectomy    . Abdominal hysterectomy      TAH-BSO, endometriosis     FAMILY HISTORY: family history includes Cancer in her brother and father; Diabetes in an unspecified family member; and Kidney disease in her brother and unspecified family member. Her father died of probable stomach cancer in his 84s. Her mother died in her early 35s from "old age" no family history breast cancer.   SOCIAL HISTORY:  reports that she quit smoking about 20 years ago. She has never used smokeless tobacco. She reports that  drinks alcohol. She reports that she does not use illicit drugs. Widowed for the past 20 years, 23 children living. She worked at a variety of jobs including working as a Merchandiser, retail in a [employer] .  ALLERGIES: Cephalexin; Avelox; Ciprofloxacin; Codeine; Ibuprofen; Lipitor; Macrodantin; Morphine sulfate; Nitrofurantoin monohyd macro; Paroxetine hcl; Penicillins; Salicylic acid; Sulfa antibiotics; and Vioxx   MEDICATIONS:  Current Outpatient Prescriptions  Medication Sig Dispense Refill  . Cholecalciferol (VITAMIN D3) 2000 UNITS TABS Take 1 tablet by mouth daily.        . metFORMIN (GLUCOPHAGE) 500 MG tablet Take 500 mg by mouth 2 (two) times daily.         No current  facility-administered medications for this encounter.     REVIEW OF SYSTEMS:  Pertinent items are noted in HPI.    PHYSICAL EXAM:  weight is 182 lb (82.555 kg). Her oral temperature is 97.4 F (36.3 C). Her blood pressure is 129/56 and her pulse is 66. Her respiration is 20.   Alert and oriented 79 year old white female appearing her stated age. Head and neck examination: Grossly unremarkable. Nodes: Without palpable cervical, supraclavicular, or axillary lymphadenopathy. Chest: Lungs clear. Back: Without spinal or CVA tenderness. Heart: Regular rate and rhythm.: On  inspection of the left breast there is an area of ecchymosis at 9:00 at the site of her biopsy. There is a palpable mass measuring approximately 2.5 cm at 10:00. The mass is visible when she lies down. Abdomen: Without hepatomegaly. Extremities: Without edema.   LABORATORY DATA:  Lab Results  Component Value Date   WBC 16.1* 08/02/2010   HGB 12.7 08/02/2010   HCT 37.5 08/02/2010   MCV 96.4 08/02/2010   PLT 136* 08/02/2010   Lab Results  Component Value Date   NA 140 08/02/2010   K 4.8 08/02/2010   CL 106 08/02/2010   CO2 25 08/02/2010   Lab Results  Component Value Date   ALT 10 08/02/2010   AST 17 08/02/2010   ALKPHOS 51 08/02/2010   BILITOT 0.4 08/02/2010      IMPRESSION: Stage IIA (T2 N0 M0) invasive ductal carcinoma of the left breast. I presume that this is a luminal B subtype. I explained to the patient and her family that her local treatment options include mastectomy versus partial mastectomy with or without radiation therapy, with without adjuvant hormone therapy. She is not an ideal patient for a partial mastectomy and adjuvant hormone therapy alone since she has T2 disease. She indicates that she would like to preserve her breast if possible. If [name] feels that she has a 5 year life expectancy based on her medical comorbidities including CLL, then she should be offered radiation therapy to the left breast following a partial mastectomy. If she is offered adjuvant hormone therapy, do not feel strongly about a sentinel lymph node biopsy. She may be a candidate for neoadjuvant hormone therapy for at least 6-8 months for downsizing and improving resectability if desired by [name]. With a mastectomy she would not need adjuvant radiation therapy. She could receive her radiation therapy at the [facility] in [place] with [name]. She would be a candidate for hypo-fractionated radiation therapy and [name] would make an effort to avoid the cardiac silhouette. We  discussed the potential acute and late toxicities of radiation therapy. She can be presented at the Wednesday morning conference for additional opinions.   PLAN: As discussed above.   I spent 40 minutes minutes face to face with the patient and more than 50% of that time was spent in counseling and/or coordination of care.

## 2012-06-03 ENCOUNTER — Encounter (INDEPENDENT_AMBULATORY_CARE_PROVIDER_SITE_OTHER): Payer: Self-pay

## 2012-06-03 ENCOUNTER — Encounter (HOSPITAL_COMMUNITY): Payer: Self-pay

## 2012-06-03 ENCOUNTER — Encounter (HOSPITAL_COMMUNITY): Payer: Medicare Other | Attending: Hematology and Oncology

## 2012-06-03 VITALS — BP 118/61 | HR 61 | Temp 97.7°F | Resp 18 | Ht 63.0 in | Wt 183.2 lb

## 2012-06-03 DIAGNOSIS — C50912 Malignant neoplasm of unspecified site of left female breast: Secondary | ICD-10-CM

## 2012-06-03 DIAGNOSIS — Z170 Estrogen receptor positive status [ER+]: Secondary | ICD-10-CM

## 2012-06-03 DIAGNOSIS — C50319 Malignant neoplasm of lower-inner quadrant of unspecified female breast: Secondary | ICD-10-CM

## 2012-06-03 NOTE — Patient Instructions (Addendum)
[facility] Discharge Instructions  RECOMMENDATIONS MADE BY THE CONSULTANT AND ANY TEST RESULTS WILL BE SENT TO YOUR REFERRING PHYSICIAN.  EXAM FINDINGS BY THE PHYSICIAN TODAY AND SIGNS OR SYMPTOMS TO REPORT TO CLINIC OR PRIMARY PHYSICIAN: Exam and discussion by Dr. Sharia Reeve.  Options are : -Surgery (remove breast) then hormonal pill -Hormonal pill to shrink tumor then re-evaluate in 3 - 6 months for possible lumpectomy -Hormonal pill only   MEDICATIONS PRESCRIBED:  none  INSTRUCTIONS GIVEN AND DISCUSSED: We need to check a bone density and do ultrasound of your breast for measurement of the tumor prior to starting hormonal agent.  SPECIAL INSTRUCTIONS/FOLLOW-UP: Follow-up here in 2 weeks.  Thank you for choosing [name] to provide your oncology and hematology care.  To afford each patient quality time with our providers, please arrive at least 15 minutes before your scheduled appointment time.  With your help, our goal is to use those 15 minutes to complete the necessary work-up to ensure our physicians have the information they need to help with your evaluation and healthcare recommendations.    Effective January 1st, 2014, we ask that you re-schedule your appointment with our physicians should you arrive 10 or more minutes late for your appointment.  We strive to give you quality time with our providers, and arriving late affects you and other patients whose appointments are after yours.    Again, thank you for choosing [facility].  Our hope is that these requests will decrease the amount of time that you wait before being seen by our physicians.       _____________________________________________________________  Should you have questions after your visit to [facility], please contact our office at [phone] between the hours of 8:30 a.m. and 5:00 p.m.  Voicemails left after 4:30 p.m. will not be returned until the  following business day.  For prescription refill requests, have your pharmacy contact our office with your prescription refill request.

## 2012-06-03 NOTE — Progress Notes (Addendum)
[facility] [phone]   Fax:([phone]  OFFICE PROGRESS NOTE  Colette Ribas, MD [address] [place] [address]  DIAGNOSIS:1. Clinical T2 N0M0 invasive ductal carcinoma of the left breast you have a positive other percent PR negative little percent HER-2/neu negative by fish amplification. Ki-67 32%.    INTERVAL HISTORY: [name] 79 y.o. female  With history of CLL. She was previously seen by Dr. Mariel Sleet for this diagnosis the patient has not been compliant with follow up. Last follow up was in July of 2012. She is also known to have mild thrombocytopenia history.she did not give a specific reason for a follow up.  She is here today for newly diagnosed breast cancer. She is accompanied by her daughter [name], and [name] his son . He has been seen by Dr. Ezzard Standing and Dr. Dayton Scrape. Both notes have been reviewed. Patient states that she had noticed a lump in December of 2013 and bring this to the attention of her daughter. However I was not  Until April of 2014 that she had a mammogram. This showed a spiculated mass in the upper inner quadrant of the left breast this measured 2.5 cm mammogram and core biopsy was recommended.this was done on 05/04/2012 and showed characteristics of both in the diagnosis. It has been discussion about breast conservation versus mastectomy versus radiation and patient wanted to get a medical oncology opinion on plan of care. She complaints of pain at the site of a lump and  this occasional. Presently she lives in an assisted living [place].she is independent with her ALS.     MEDICAL HISTORY: Past Medical History  Diagnosis Date  . Diabetes mellitus   . Pneumonia     history  . Kidney stones     histroy  . Hx: UTI (urinary tract infection)   . CVA (cerebral infarction) 09/2009  . Breast cancer 05/05/12    left, ER+/PR-, Her 2 -  . Leukemia     CLL, Dr Mariel Sleet, completed tx  2010  . COPD (chronic obstructive pulmonary disease)   . Hypothyroidism   . Stroke     x 3, last Nov 2011, right side weakness  . Neuropathy     diabetic- right side    ALLERGIES:  is allergic to cephalexin; levaquin; avelox; ciprofloxacin; codeine; ibuprofen; lipitor; macrodantin; morphine sulfate; nitrofurantoin monohyd macro; paroxetine hcl; penicillins; salicylic acid; sulfa antibiotics; and vioxx.  MEDICATIONS:  Current Outpatient Prescriptions  Medication Sig Dispense Refill  . Cholecalciferol (VITAMIN D3) 2000 UNITS TABS Take 1 tablet by mouth daily.        . metFORMIN (GLUCOPHAGE) 500 MG tablet Take 500 mg by mouth 2 (two) times daily.         No current facility-administered medications for this visit.    SURGICAL HISTORY:  Past Surgical History  Procedure Laterality Date  . Cardiac catheterization    . Lithotripsy    . Breast lumpectomy      left  breast  . Replacement total knee      right  . Appendectomy    . Ovarian cyst removal    . Total hip arthroplasty      left  . Bartholin gland cyst excision    . Total hip arthroplasty      right  . Cholecystectomy    . Abdominal hysterectomy      TAH-BSO, endometriosis  . Core biopsy left breast  04/2012     REVIEW OF SYSTEMS: she denies weight loss, denies lymphadenopathy, denies night sweats and denies headaches. She has a bruises on the skin of the forearm.This is now new.14 point review of system is as in the history above otherwise negative.  PHYSICAL EXAMINATION:  Blood pressure 118/61, pulse 61, temperature 97.7 F (36.5 C), temperature source Oral, resp. rate 18, height 5\' 3"  (1.6 m), weight 183 lb 3.2 oz (83.099 kg).  GENERAL: No distress,obese.  SKIN: few bruises on day of the extremities mostly the forearm. HEAD: Normocephalic, No masses, lesions, tenderness or abnormalities  EYES: Conjunctiva are pink and non-injected  ENT: External ears normal ,lips, buccal mucosa, and tongue normal and mucous  membranes are moist  LYMPH: No palpable lymphadenopathy, in the neck supraclavicular or axillary areas. The jaw area is also was negative for palpable lymphadenopathy. BREAST:right breast was normal without mass, the appetite he now quadrant of the left breast had a 4 x 4 centimeter( longitudinal and transverse directions) mass lesion there was movable. It was non tender and no surrounding erythema. LUNGS: clear to auscultation , no crackles or wheezes HEART: regular rate & rhythm, no murmurs, no gallops, S1 normal and S2 normal  ABDOMEN: Abdomen soft, non-tender, normal bowel sounds, no masses or organomegaly and no hepatosplenomegaly palpable. MSK: No CVA tenderness and no tenderness on percussion of the back or rib cage. EXTREMITIES: No edema, no skin discoloration or tenderness NEURO: alert & oriented , no focal motor/sensory deficits.     LABORATORY DATA: Lab Results  Component Value Date   WBC 16.1* 08/02/2010   HGB 12.7 08/02/2010   HCT 37.5 08/02/2010   MCV 96.4 08/02/2010   PLT 136* 08/02/2010      Chemistry      Component Value Date/Time   NA 140 08/02/2010 1011   K 4.8 08/02/2010 1011   CL 106 08/02/2010 1011   CO2 25 08/02/2010 1011   BUN 23 08/02/2010 1011   CREATININE 1.02 08/02/2010 1011      Component Value Date/Time   CALCIUM 9.3 08/02/2010 1011   ALKPHOS 51 08/02/2010 1011   AST 17 08/02/2010 1011   ALT 10 08/02/2010 1011   BILITOT 0.4 08/02/2010 1011       RADIOGRAPHIC STUDIES: US Guided Needle Placement  05/17/2012   **ADDENDUM** CREATED: 05/07/2012 09:17:25  Histologic evaluation demonstrates grade II invasive ductal carcinoma.  This is concordant with the imaging findings.  Results were discussed with [name], the patient's daughter and power of attorney, by telephone at the request of the patient.  The patient's daughter states that her mother has some soreness but no other problems following the biopsy.  Surgical consultation was scheduled with Dr. Ovidio Kin for  05/13/2012 at 2:00 p.m. at the request of Dr. Phillips Odor.  Sonnie Alamo, RN, telephoned report to Dr. Lamar Blinks office in his absence.  **END ADDENDUM** SIGNED BY: Cain Saupe, M.D.  05/17/2012   *RADIOLOGY REPORT*  Clinical Data:  Spiculated mass at 10 o'clock 8 cm from the left nipple.  ULTRASOUND GUIDED VACUUM ASSISTED CORE BIOPSY OF THE LEFT BREAST  The patient and I discussed the procedure of ultrasound-guided biopsy, including benefits and alternatives.  We discussed the high likelihood of a successful procedure. We discussed the risks of the procedure including infection, bleeding, tissue injury, clip migration, and inadequate sampling.  Written informed consent was given. Appropriate time-out was performed.  Using sterile technique, 2% lidocaine, ultrasound guidance, and a 12 gauge vacuum assisted needle, biopsy  was performed of the mass at 10 o'clock 8 cm from the left nipple using an oblique mediolateral approach.  At the conclusion of the procedure, a ribbon tissue marker clip was deployed into the biopsy cavity. Follow-up 2-view mammogram was performed and dictated separately.  IMPRESSION: Ultrasound-guided biopsy of a spiculated mass at 10 o'clock 8 cm from the left nipple.  No apparent complications.   Original Report Authenticated By: Cain Saupe, M.D.   [place] Core Biopsy  05/07/2012   **ADDENDUM** CREATED: 05/07/2012 09:17:25  Histologic evaluation demonstrates grade II invasive ductal carcinoma.  This is concordant with the imaging findings.  Results were discussed with [name], the patient's daughter and power of attorney, by telephone at the request of the patient.  The patient's daughter states that her mother has some soreness but no other problems following the biopsy.  Surgical consultation was scheduled with Dr. Ovidio Kin for 05/13/2012 at 2:00 p.m. at the request of Dr. Phillips Odor.  Sonnie Alamo, RN, telephoned report to Dr. Lamar Blinks office in his absence.  **END ADDENDUM** SIGNED BY:  Cain Saupe, M.D.  05/05/2012   *RADIOLOGY REPORT*  Clinical Data:  Spiculated mass at 10 o'clock 8 cm from the left nipple.  ULTRASOUND GUIDED VACUUM ASSISTED CORE BIOPSY OF THE LEFT BREAST  The patient and I discussed the procedure of ultrasound-guided biopsy, including benefits and alternatives.  We discussed the high likelihood of a successful procedure. We discussed the risks of the procedure including infection, bleeding, tissue injury, clip migration, and inadequate sampling.  Written informed consent was given. Appropriate time-out was performed.  Using sterile technique, 2% lidocaine, ultrasound guidance, and a 12 gauge vacuum assisted needle, biopsy was performed of the mass at 10 o'clock 8 cm from the left nipple using an oblique mediolateral approach.  At the conclusion of the procedure, a ribbon tissue marker clip was deployed into the biopsy cavity. Follow-up 2-view mammogram was performed and dictated separately.  IMPRESSION: Ultrasound-guided biopsy of a spiculated mass at 10 o'clock 8 cm from the left nipple.  No apparent complications.   Original Report Authenticated By: Cain Saupe, M.D.   Mm Digital Diagnostic Unilat L  05/05/2012   *RADIOLOGY REPORT*  Clinical Data:  Ultrasound-guided core needle biopsy of a spiculated mass in the left upper inner quadrant at 10 o'clock, 8 cm from the nipple.  DIGITAL DIAGNOSTIC LEFT MAMMOGRAM  Comparison:  Previous exams.  Findings:  Films are performed following ultrasound guided biopsy of a spiculated mass at 10 o'clock 8 cm from the left nipple.  The ribbon clip is appropriately positioned within the mass.  IMPRESSION: Appropriate clip placement following ultrasound-guided core needle biopsy of a spiculated mass at 10 o'clock, 8 cm from the left nipple.   Original Report Authenticated By: Cain Saupe, M.D.     ASSESSMENT:  Ms. Mangham has clinical stage II invasive ductal carcinoma of the left breast ER positive, PR negative and HER-2/neu  negative diagnosed 05/05/2012. Is no clinical or PSA evidence of axillary lymph node involvement or metastatic disease. Given the T. Stage for the imaging workup is not indicated. These are my discussion with her family they are concerned about the effect of anesthesia on her given prior surgeries. I discussed the treatment options with her and her family. Discussed that lumpectomy followed by adjuvant hormonal therapy for about 5 years is one way to go giving her age and hormone receptor status I will recommend against the chemotherapy. There is also a possibility that breast preservation and may  not be feasible in which case mastectomy followed by adjuvant endocrine therapy will be another way to go. Presents data suggests that elderly female with early-stage breast cancer may not benefit from radiation. I think this can be safely skipped in her case if clear margins I obtained and patient is on adjuvant endocrine therapy.  Alternatively, patient can go the neoadjuvant route, in which case the 6 months of endocrine therapy can be given with close monitoring of the lesion and subsequently surgical resection in which case surgery might be less invasive and potential for achieving a clear margin would be higher. She seems interested in this option.   She appears to have what appears like a stage 0 CLL which do not seem to be causing problem at this time. Given the prognosis of arm this disease in most cases I think that the patient can be  treated with curative intent . She needs to keep up with her follow up appointments. I counseled her on compliance.  Marland Kitchen PLAN:  1. Ordered a baseline DEXA scan 2. I ordered a baseline ultrasound for future assessment of response especially considering that the last mammogram was in April it is at least a month old. 3. She will return to clinic in 2 weeks, at that time she will begin neoadjuvant endocrine therapy with aromatase inhibitor preferably Arimidex to be followed  by surgery subsequently. However I warned that response does not always happen and stable disease occur even progression. 4. I asked them to discuss the possibility of doing future breast surgery under local anesthesia with sudden. However I will leave this to the discretion of the surgeon. 5. Radiation can be skipped if patient undergoes surgery with clear margins. 6. Clinically her CLL seems to be under control as she lacks B. Symptoms.   I spent 20 minutes counseling the patient face to face. The total time spent in the appointment was 40 minutes.   Sherral Hammers, MD FACP. Hematology/Oncology.

## 2012-06-09 ENCOUNTER — Ambulatory Visit (HOSPITAL_COMMUNITY)
Admission: RE | Admit: 2012-06-09 | Discharge: 2012-06-09 | Disposition: A | Discharge status: Home / Self Care | Payer: Medicare Other | Source: Ambulatory Visit | Attending: Hematology and Oncology | Admitting: Hematology and Oncology

## 2012-06-09 DIAGNOSIS — Z780 Asymptomatic menopausal state: Secondary | ICD-10-CM | POA: Insufficient documentation

## 2012-06-09 DIAGNOSIS — C50912 Malignant neoplasm of unspecified site of left female breast: Secondary | ICD-10-CM

## 2012-06-09 DIAGNOSIS — M818 Other osteoporosis without current pathological fracture: Secondary | ICD-10-CM | POA: Insufficient documentation

## 2012-06-09 DIAGNOSIS — C50919 Malignant neoplasm of unspecified site of unspecified female breast: Secondary | ICD-10-CM | POA: Insufficient documentation

## 2012-06-09 DIAGNOSIS — Z13820 Encounter for screening for osteoporosis: Secondary | ICD-10-CM | POA: Insufficient documentation

## 2012-06-17 ENCOUNTER — Encounter (HOSPITAL_COMMUNITY): Payer: Medicare Other | Attending: Hematology and Oncology

## 2012-06-17 ENCOUNTER — Encounter (HOSPITAL_COMMUNITY): Payer: Self-pay

## 2012-06-17 ENCOUNTER — Encounter (HOSPITAL_COMMUNITY): Payer: Self-pay | Admitting: *Deleted

## 2012-06-17 ENCOUNTER — Inpatient Hospital Stay (HOSPITAL_COMMUNITY)
Admission: AD | Admit: 2012-06-17 | Discharge: 2012-06-19 | DRG: 194 | Disposition: A | Discharge status: Nursing Home (Includes ICF) | Payer: Medicare Other | Source: Ambulatory Visit | Attending: Internal Medicine | Admitting: Internal Medicine

## 2012-06-17 ENCOUNTER — Ambulatory Visit (HOSPITAL_COMMUNITY)
Admission: RE | Admit: 2012-06-17 | Discharge: 2012-06-17 | Disposition: A | Discharge status: Home / Self Care | Payer: Medicare Other | Source: Ambulatory Visit | Attending: Hematology and Oncology | Admitting: Hematology and Oncology

## 2012-06-17 VITALS — BP 128/64 | HR 92 | Temp 98.2°F | Resp 16 | Wt 174.6 lb

## 2012-06-17 DIAGNOSIS — Z96649 Presence of unspecified artificial hip joint: Secondary | ICD-10-CM

## 2012-06-17 DIAGNOSIS — C50919 Malignant neoplasm of unspecified site of unspecified female breast: Secondary | ICD-10-CM

## 2012-06-17 DIAGNOSIS — Z881 Allergy status to other antibiotic agents status: Secondary | ICD-10-CM

## 2012-06-17 DIAGNOSIS — Z886 Allergy status to analgesic agent status: Secondary | ICD-10-CM

## 2012-06-17 DIAGNOSIS — Z8673 Personal history of transient ischemic attack (TIA), and cerebral infarction without residual deficits: Secondary | ICD-10-CM

## 2012-06-17 DIAGNOSIS — T380X5A Adverse effect of glucocorticoids and synthetic analogues, initial encounter: Secondary | ICD-10-CM | POA: Diagnosis present

## 2012-06-17 DIAGNOSIS — Z885 Allergy status to narcotic agent status: Secondary | ICD-10-CM

## 2012-06-17 DIAGNOSIS — Z883 Allergy status to other anti-infective agents status: Secondary | ICD-10-CM

## 2012-06-17 DIAGNOSIS — C50912 Malignant neoplasm of unspecified site of left female breast: Secondary | ICD-10-CM

## 2012-06-17 DIAGNOSIS — E785 Hyperlipidemia, unspecified: Secondary | ICD-10-CM | POA: Diagnosis present

## 2012-06-17 DIAGNOSIS — M810 Age-related osteoporosis without current pathological fracture: Secondary | ICD-10-CM | POA: Insufficient documentation

## 2012-06-17 DIAGNOSIS — Z880 Allergy status to penicillin: Secondary | ICD-10-CM

## 2012-06-17 DIAGNOSIS — E232 Diabetes insipidus: Secondary | ICD-10-CM

## 2012-06-17 DIAGNOSIS — E119 Type 2 diabetes mellitus without complications: Secondary | ICD-10-CM

## 2012-06-17 DIAGNOSIS — E1149 Type 2 diabetes mellitus with other diabetic neurological complication: Secondary | ICD-10-CM | POA: Diagnosis present

## 2012-06-17 DIAGNOSIS — J441 Chronic obstructive pulmonary disease with (acute) exacerbation: Secondary | ICD-10-CM

## 2012-06-17 DIAGNOSIS — E1142 Type 2 diabetes mellitus with diabetic polyneuropathy: Secondary | ICD-10-CM | POA: Diagnosis present

## 2012-06-17 DIAGNOSIS — R059 Cough, unspecified: Secondary | ICD-10-CM | POA: Insufficient documentation

## 2012-06-17 DIAGNOSIS — Z170 Estrogen receptor positive status [ER+]: Secondary | ICD-10-CM

## 2012-06-17 DIAGNOSIS — I6350 Cerebral infarction due to unspecified occlusion or stenosis of unspecified cerebral artery: Secondary | ICD-10-CM | POA: Diagnosis present

## 2012-06-17 DIAGNOSIS — R05 Cough: Secondary | ICD-10-CM | POA: Insufficient documentation

## 2012-06-17 DIAGNOSIS — Z888 Allergy status to other drugs, medicaments and biological substances status: Secondary | ICD-10-CM

## 2012-06-17 DIAGNOSIS — Z96659 Presence of unspecified artificial knee joint: Secondary | ICD-10-CM

## 2012-06-17 DIAGNOSIS — R0602 Shortness of breath: Secondary | ICD-10-CM

## 2012-06-17 DIAGNOSIS — C9110 Chronic lymphocytic leukemia of B-cell type not having achieved remission: Secondary | ICD-10-CM

## 2012-06-17 DIAGNOSIS — E039 Hypothyroidism, unspecified: Secondary | ICD-10-CM | POA: Diagnosis present

## 2012-06-17 DIAGNOSIS — Z87891 Personal history of nicotine dependence: Secondary | ICD-10-CM

## 2012-06-17 DIAGNOSIS — J189 Pneumonia, unspecified organism: Principal | ICD-10-CM

## 2012-06-17 DIAGNOSIS — Z882 Allergy status to sulfonamides status: Secondary | ICD-10-CM

## 2012-06-17 LAB — COMPREHENSIVE METABOLIC PANEL
ALT: 6 U/L (ref 0–35)
Albumin: 3.7 g/dL (ref 3.5–5.2)
Alkaline Phosphatase: 59 U/L (ref 39–117)
BUN: 20 mg/dL (ref 6–23)
Chloride: 98 mEq/L (ref 96–112)
GFR calc Af Amer: 48 mL/min — ABNORMAL LOW (ref >90)
Glucose, Bld: 145 mg/dL — ABNORMAL HIGH (ref 70–99)
Potassium: 3.4 mEq/L — ABNORMAL LOW (ref 3.5–5.1)
Total Bilirubin: 0.7 mg/dL (ref 0.3–1.2)

## 2012-06-17 LAB — CBC WITH DIFFERENTIAL/PLATELET
HCT: 38.7 % (ref 36.0–46.0)
Hemoglobin: 13.5 g/dL (ref 12.0–15.0)
Lymphocytes Relative: 44 % (ref 12–46)
Monocytes Absolute: 0.8 10*3/uL (ref 0.1–1.0)
Monocytes Relative: 5 % (ref 3–12)
Neutro Abs: 9.6 10*3/uL — ABNORMAL HIGH (ref 1.7–7.7)
RBC: 4.08 MIL/uL (ref 3.87–5.11)
WBC: 18.5 10*3/uL — ABNORMAL HIGH (ref 4.0–10.5)

## 2012-06-17 LAB — BLOOD GAS, ARTERIAL
Bicarbonate: 21.7 mEq/L (ref 20.0–24.0)
FIO2: 0.21 %
Patient temperature: 37
pCO2 arterial: 32 mmHg — ABNORMAL LOW (ref 35.0–45.0)
pH, Arterial: 7.446 (ref 7.350–7.450)
pO2, Arterial: 65.1 mmHg — ABNORMAL LOW (ref 80.0–100.0)

## 2012-06-17 LAB — PRO B NATRIURETIC PEPTIDE: Pro B Natriuretic peptide (BNP): 271 pg/mL (ref 0–450)

## 2012-06-17 MED ORDER — IPRATROPIUM BROMIDE 0.02 % IN SOLN: 0.5000 mg | Freq: Four times a day (QID) | RESPIRATORY_TRACT | Status: DC | PRN

## 2012-06-17 MED ORDER — IPRATROPIUM BROMIDE 0.02 % IN SOLN
0.5000 mg | RESPIRATORY_TRACT | Status: DC
  Administered 2012-06-17 – 2012-06-18 (×7): 0.5 mg via RESPIRATORY_TRACT
  Filled 2012-06-17 (×7): qty 2.5

## 2012-06-17 MED ORDER — PREDNISONE 20 MG PO TABS
40.0000 mg | ORAL_TABLET | Freq: Two times a day (BID) | ORAL | Status: DC
  Administered 2012-06-17 – 2012-06-18 (×2): 40 mg via ORAL
  Filled 2012-06-17 (×2): qty 2

## 2012-06-17 MED ORDER — ZOLEDRONIC ACID 5 MG/100ML IV SOLN: 5.0000 mg | Freq: Once | INTRAVENOUS | Status: DC

## 2012-06-17 MED ORDER — CALCIUM CARBONATE-VITAMIN D 500-200 MG-UNIT PO TABS
1.0000 | ORAL_TABLET | Freq: Two times a day (BID) | ORAL | Status: DC
  Filled 2012-06-17 (×3): qty 1

## 2012-06-17 MED ORDER — AZITHROMYCIN 500 MG IV SOLR
500.0000 mg | INTRAVENOUS | Status: DC
  Administered 2012-06-17 – 2012-06-18 (×2): 500 mg via INTRAVENOUS
  Filled 2012-06-17 (×4): qty 500

## 2012-06-17 MED ORDER — ALBUTEROL SULFATE (5 MG/ML) 0.5% IN NEBU
2.5000 mg | INHALATION_SOLUTION | RESPIRATORY_TRACT | Status: DC
  Administered 2012-06-17 – 2012-06-18 (×7): 2.5 mg via RESPIRATORY_TRACT
  Filled 2012-06-17 (×6): qty 0.5

## 2012-06-17 MED ORDER — ALBUTEROL SULFATE (5 MG/ML) 0.5% IN NEBU
2.5000 mg | INHALATION_SOLUTION | Freq: Four times a day (QID) | RESPIRATORY_TRACT | Status: DC | PRN
  Filled 2012-06-17: qty 0.5

## 2012-06-17 MED ORDER — ANASTROZOLE 1 MG PO TABS: 1.0000 mg | ORAL_TABLET | Freq: Every day | ORAL | Status: DC

## 2012-06-17 MED ORDER — ACETAMINOPHEN 325 MG PO TABS: 650.0000 mg | ORAL_TABLET | Freq: Four times a day (QID) | ORAL | Status: DC | PRN

## 2012-06-17 MED ORDER — SODIUM CHLORIDE 0.9 % IV SOLN: 250.0000 mL | INTRAVENOUS | Status: DC | PRN

## 2012-06-17 MED ORDER — INSULIN ASPART 100 UNIT/ML ~~LOC~~ SOLN
0.0000 [IU] | Freq: Three times a day (TID) | SUBCUTANEOUS | Status: DC
  Administered 2012-06-18: 2 [IU] via SUBCUTANEOUS
  Administered 2012-06-18: 5 [IU] via SUBCUTANEOUS
  Administered 2012-06-18: 08:00:00 via SUBCUTANEOUS
  Administered 2012-06-19 (×2): 2 [IU] via SUBCUTANEOUS

## 2012-06-17 MED ORDER — SODIUM CHLORIDE 0.9 % IJ SOLN: 3.0000 mL | INTRAMUSCULAR | Status: DC | PRN

## 2012-06-17 MED ORDER — CALCIUM CARBONATE-VITAMIN D 600-400 MG-UNIT PO TABS: 1.0000 | ORAL_TABLET | Freq: Two times a day (BID) | ORAL | Status: AC

## 2012-06-17 MED ORDER — TIOTROPIUM BROMIDE MONOHYDRATE 18 MCG IN CAPS: 18.0000 ug | ORAL_CAPSULE | Freq: Every day | RESPIRATORY_TRACT | Status: DC

## 2012-06-17 MED ORDER — HYDROCODONE-ACETAMINOPHEN 5-325 MG PO TABS: 1.0000 | ORAL_TABLET | Freq: Four times a day (QID) | ORAL | Status: DC | PRN

## 2012-06-17 MED ORDER — AMOXICILLIN-POT CLAVULANATE 875-125 MG PO TABS
1.0000 | ORAL_TABLET | Freq: Two times a day (BID) | ORAL | Status: DC
  Administered 2012-06-17 – 2012-06-19 (×4): 1 via ORAL
  Filled 2012-06-17 (×4): qty 1

## 2012-06-17 MED ORDER — METFORMIN HCL 500 MG PO TABS
500.0000 mg | ORAL_TABLET | Freq: Two times a day (BID) | ORAL | Status: DC
  Administered 2012-06-18 – 2012-06-19 (×3): 500 mg via ORAL
  Filled 2012-06-17 (×3): qty 1

## 2012-06-17 MED ORDER — CALCIUM CARBONATE-VITAMIN D 500-200 MG-UNIT PO TABS
1.0000 | ORAL_TABLET | Freq: Two times a day (BID) | ORAL | Status: DC
  Administered 2012-06-17 – 2012-06-19 (×4): 1 via ORAL
  Filled 2012-06-17 (×4): qty 1

## 2012-06-17 MED ORDER — DEXTROSE 5 % IV SOLN
INTRAVENOUS | Status: AC
  Filled 2012-06-17: qty 500

## 2012-06-17 MED ORDER — SODIUM CHLORIDE 0.9 % IJ SOLN
3.0000 mL | Freq: Two times a day (BID) | INTRAMUSCULAR | Status: DC
  Administered 2012-06-18 – 2012-06-19 (×3): 3 mL via INTRAVENOUS

## 2012-06-17 MED ORDER — ANASTROZOLE 1 MG PO TABS
1.0000 mg | ORAL_TABLET | Freq: Every day | ORAL | Status: DC
  Filled 2012-06-17 (×2): qty 1

## 2012-06-17 NOTE — Progress Notes (Addendum)
[facility] Telephone:([phone]   Fax:([phone]  OFFICE PROGRESS NOTE  Colette Ribas, MD [address] [place] [address]  DIAGNOSIS: Clinical T2 N0 M0 invasive ductal carcinoma of the left breast you have a positive other percent PR negative little percent HER-2/neu negative by fish amplification. Ki-67 32%.     INTERVAL HISTORY:   [name] 79 y.o. female returns to the clinic today for from her prior visit of 06/03/2012.Following her visit, she was sent for a DEXA scan which showed  Osteoporosis.I also sent her for an ultrasound to obtain measurement for future reference of her left breast lesion. Since our last visit she is being having cough for at least one week. She has noticed decreased appetite, but she is still reportedly taking sufficient fluids. Cough is nonproductive. Overall she feels weaker than the last time I saw her but has not noticed any changes in the breast lesion. She also denies any antibiotic treatment hospital treatment for the cough.  She's had 9 pounds weight loss since her last visit.  MEDICAL HISTORY: Past Medical History  Diagnosis Date  . Diabetes mellitus   . Pneumonia     history  . Kidney stones     histroy  . Hx: UTI (urinary tract infection)   . CVA (cerebral infarction) 09/2009  . Breast cancer 05/05/12    left, ER+/PR-, Her 2 -  . Leukemia     CLL, Dr Mariel Sleet, completed tx 2010  . COPD (chronic obstructive pulmonary disease)   . Hypothyroidism   . Stroke     x 3, last Nov 2011, right side weakness  . Neuropathy     diabetic- right side    ALLERGIES:  is allergic to cephalexin; levaquin; avelox; ciprofloxacin; codeine; ibuprofen; lipitor; macrodantin; morphine sulfate; nitrofurantoin monohyd macro; paroxetine hcl; penicillins; salicylic acid; sulfa antibiotics; and vioxx.  MEDICATIONS:  Current Outpatient Prescriptions  Medication Sig Dispense Refill  .  HYDROcodone-acetaminophen (NORCO/VICODIN) 5-325 MG per tablet Take 1 tablet by mouth every 6 (six) hours as needed for pain.      Marland Kitchen anastrozole (ARIMIDEX) 1 MG tablet Take 1 tablet (1 mg total) by mouth daily.  30 tablet  11  . Calcium Carbonate-Vitamin D (CALTRATE 600+D) 600-400 MG-UNIT per tablet Take 1 tablet by mouth 2 (two) times daily.  100 tablet  3  . metFORMIN (GLUCOPHAGE) 500 MG tablet Take 500 mg by mouth 2 (two) times daily.         Current Facility-Administered Medications  Medication Dose Route Frequency Provider Last Rate Last Dose  . calcium-vitamin D (OSCAL WITH D) 500-200 MG-UNIT per tablet 1 tablet  1 tablet Oral BID Sherral Hammers, MD      . Melene Muller ON 07/15/2012] zoledronic acid (RECLAST) injection 5 mg  5 mg Intravenous Once Sherral Hammers, MD        SURGICAL HISTORY:  Past Surgical History  Procedure Laterality Date  . Cardiac catheterization    . Lithotripsy    . Breast lumpectomy      left  breast  . Replacement total knee      right  . Appendectomy    . Ovarian cyst removal    . Total hip arthroplasty      left  . Bartholin gland cyst excision    . Total hip arthroplasty      right  . Cholecystectomy    . Abdominal hysterectomy  TAH-BSO, endometriosis  . Core biopsy left breast  04/2012     REVIEW OF SYSTEMS: 14 point review of system is as in the history above otherwise negative.Additionally, patient appears weak. and was in a wheelchair.   PHYSICAL EXAMINATION:  Blood pressure 128/64, pulse 92, temperature 98.2 F (36.8 C), temperature source Oral, resp. rate 16, weight 174 lb 9.6 oz (79.198 kg). GENERAL: No distress, well nourished.  SKIN:  No rashes or significant lesions  HEAD: Normocephalic, No masses, lesions, tenderness or abnormalities  EYES: Conjunctiva are pink and non-injected  ENT: External ears normal ,lips, buccal mucosa, and tongue normal and mucous membranes are moist . Patient did have some teeth, but she is mostly  edentulous. LYMPH: No palpable lymphadenopathy in the neck, supraclavicular or axilla areas,  BREAST:unchanged from previous visit. LUNGS:right-sided crackles involving the inferior 2/3 of the right lung left lung was clear HEART: regular rate & rhythm, no murmurs, no gallops, S1 normal and S2 normal  ABDOMEN: Abdomen soft, non-tender, normal bowel sounds, no masses or organomegaly and no hepatosplenomegaly  JWJ:XBJYNWGN gait. EXTREMITIES: No edema or tenderness NEURO: alert & oriented , no focal motor deficits.     LABORATORY DATA: Lab Results  Component Value Date   WBC 16.1* 08/02/2010   HGB 12.7 08/02/2010   HCT 37.5 08/02/2010   MCV 96.4 08/02/2010   PLT 136* 08/02/2010      Chemistry      Component Value Date/Time   NA 140 08/02/2010 1011   K 4.8 08/02/2010 1011   CL 106 08/02/2010 1011   CO2 25 08/02/2010 1011   BUN 23 08/02/2010 1011   CREATININE 1.02 08/02/2010 1011      Component Value Date/Time   CALCIUM 9.3 08/02/2010 1011   ALKPHOS 51 08/02/2010 1011   AST 17 08/02/2010 1011   ALT 10 08/02/2010 1011   BILITOT 0.4 08/02/2010 1011       RADIOGRAPHIC STUDIES: US Breast Left  06/09/2012   *RADIOLOGY REPORT*  Clinical Data:  Known breast cancer in the left upper inner quadrant. Patient's oncologist requests baseline study prior to neoadjuvant endocrine therapy.  LEFT BREAST ULTRASOUND  Comparison:  04/28/2012  On physical exam, I palpate a 2 cm mass at 10 o'clock 8 cm from the left nipple.  Findings: Ultrasound is performed, showing an irregular hypoechoic mass at 10 o'clock 8 cm from the left nipple measuring 2.2 x 2.0 x 2.1 cm.  This is consistent with the known carcinoma.  IMPRESSION: The known carcinoma in the left upper inner quadrant at 10 o'clock 8 cm from the left nipple measures 2.2 x 2.0 x 2.1 cm.  RECOMMENDATION: Treatment plan.  I have discussed the findings and recommendations with the patient. Results were also provided in writing at the conclusion of the visit.  If  applicable, a reminder letter will be sent to the patient regarding the next appointment.  BI-RADS CATEGORY 6:  Known biopsy-proven malignancy - appropriate action should be taken.   Original Report Authenticated By: Cain Saupe, M.D.   Dg Bone Density  06/09/2012   The Bone Mineral Densitometry hard-copy report (which includes all data, graphical display, and FRAX results when applicable) has been sent directly to the ordering physician.  This report can also be obtained electronically by viewing images for this exam through the performing facility's EMR, or by logging directly into [employer].   Original Report Authenticated By: Ulyses Southward, M.D.     ASSESSMENT:  1. Clinical T2, N0, M0 invasive  ductal carcinoma of a left breast. Patient is to begin neoadjuvant endocrine therapy with Arimidex as soon as this is available.She uses a Recruitment consultant.Response should be in 3-6 months and in decision on surgery made at that time . 2. Osteoporosis: Most likely related age and race.Bisphosphonate therapy is indicated especially considering that this will likely get worse with aromatase inhibitor therapy. 3. Cough: suspicious for pneumonia given right sided crackles. 4. Frailty:Occasioned by age and possibly upper respiratory infection 5.long-standing diagnosis of CLL. Seems to be stable in this regard.  PLAN:  1. I ordered Arimidex 1 mg daily.she was instructed to start this as none she gets it. 2.I ordered Calcium/  vitamin D 600/400 one tablet twice daily.I stopped vitamin D 2000 units to avoid toxicity problems and  Polypharmacy 3.I ordered chest x-ray 2 views to rule out pneumonia.I will follow up with the results. 4. I recommended Zoledronic acid 5 mg every year.She prefers to get her zoledronic acid at her next visit. This is to be given in yearly for osteoporosis.she wants to go for her granddaughter's wedding before starting this due to concerns that this might cause her some problems. We  talked about the side effects including but not limited to osteonecrosis of the jaw, patient states that she has not planned upcoming dental appointment or procedure.She accepts the risk. She knows to  obtain clearance from MD before any procedure in the future after request. 5. She'll return to clinic in 4 weeks for follow up.   All questions were satisfactorily answered.She knows to call if she has any concern.I had a detailed discussion with her and her son regarding the above conditions.  I spent more  than 20 minutes counseling the patient face to face. The total time spent in the appointment was 40 minutes.   Sherral Hammers, MD FACP. Hematology/Oncology.     Addendum:WBC today showed related increase in neutrophil count which supports the diagnosis of pneumonia. Chest x-ray showed evidence of consolidation in the right lung also consistent with pneumonia. This corresponds to clinical exam findings. I spoke to Dr. Mellody Drown regarding direct admission of this patient. He accepts to admit the patient for parenteral antibiotics.I discussed Ms. Corena Herter registered nurse who will arrange for the bed and contact the patient also with this information.Her elevated lymphocyte count is consistent with her known diagnosis of CLL.

## 2012-06-17 NOTE — Progress Notes (Signed)
Per Dr. Sharia Reeve, patient has a pneumonia diagnosis that requires inpatient admission - Dr. Rito Ehrlich is the accepting physician.  I contacted [name]'s daughter and POA, [name] [phone]), and advised her of the new diagnosis and need for admission.  Patient and daughter agree on admission.  Bed Planning contacted and bed request made on 300; spoke with Hilda Lias in admitting to inform her patient will be there in about 1.5-2 hours.  Contacted unit 300 charge nurse, Cranston Neighbor, to inform her of pending admission and to give report.

## 2012-06-17 NOTE — Patient Instructions (Signed)
[facility] Discharge Instructions  RECOMMENDATIONS MADE BY THE CONSULTANT AND ANY TEST RESULTS WILL BE SENT TO YOUR REFERRING PHYSICIAN.  EXAM FINDINGS BY THE PHYSICIAN TODAY AND SIGNS OR SYMPTOMS TO REPORT TO CLINIC OR PRIMARY PHYSICIAN: Exam findings as discussed by Dr. Sharia Reeve.  You were noted to have "crackles" in your right upper lung area, and we are performing a chest x-ray to see whether you have pneumonia.  MEDICATIONS PRESCRIBED:  1.  Arimidex - as prescribed. 2.  Calcium with Vitamin D - as prescribed 3.  Please take an over the counter cough syrup for your cough if needed.  SPECIAL INSTRUCTIONS/FOLLOW-UP: 1.  Your bone scanned showed that you have osteoporosis, and you are being started on Reclast (once a year injection) to help improve your bone density changes from the osteoporosis.   2.  We will call you with any abnormal lab results. 3.  We will contact you if your chest x-ray shows that you need to be placed on medications. 4.  Return in 4 weeks for another MD visit.  Thank you for choosing [name] to provide your oncology and hematology care.  To afford each patient quality time with our providers, please arrive at least 15 minutes before your scheduled appointment time.  With your help, our goal is to use those 15 minutes to complete the necessary work-up to ensure our physicians have the information they need to help with your evaluation and healthcare recommendations.    Effective January 1st, 2014, we ask that you re-schedule your appointment with our physicians should you arrive 10 or more minutes late for your appointment.  We strive to give you quality time with our providers, and arriving late affects you and other patients whose appointments are after yours.    Again, thank you for choosing [facility].  Our hope is that these requests will decrease the amount of time that you wait before being seen by our  physicians.       _____________________________________________________________  Should you have questions after your visit to [facility], please contact our office at [phone] between the hours of 8:30 a.m. and 5:00 p.m.  Voicemails left after 4:30 p.m. will not be returned until the following business day.  For prescription refill requests, have your pharmacy contact our office with your prescription refill request.

## 2012-06-17 NOTE — H&P (Signed)
[facility] History and Physical  [name] ZOX:[id number] DOB: 11-10-1926 DOA: 06/17/2012  Referring physician: Erline Hau, oncology PCP: Colette Ribas, MD  Specialists: None  Chief Complaint: Shortness of breath and cough  HPI: [name] is a 79 y.o. female  Past medical history of COPD, CLL and diabetes who presented to her oncologist today for routine checkup. She noted that she been feeling run down in the last few weeks and noted to oncologist that she was complaining of some shortness of breath with productive cough. On exam she was noted to have some left basilar crackles, so a chest x-ray was ordered. Lab work done the oncology office noted ALT white blood cell count, however in comparison to previous labs she may have chronic leukocytosis from CLL. Patient was found to have a suspected infiltrate and given her severe multiple antibiotic allergies plus symptoms, it was felt best she come in for treatment. Oncology discussed the case with hospitalists and arrangements were made for per patient to come in as a direct admission. After arrival to floor, the patient was interviewed. She gives a history of feeling run down for the last few weeks with a productive cough. She states that she's noted some wheezing, especially with exertion.  Review of Systems: Patient seen after arriving to the floor. Doing okay. Complains of mild cough with production of mild brownish sputum. Physical little short of breath but not too bad. Feels very fatigued. Denies any headache, vision changes, dysphasia, chest pain, palpitations. Does complain of wheezing. Denies any abdominal pain, hematuria, dysuria, constipation, diarrhea, focal extremity numbness or weakness or pain. Review systems otherwise negative.  Past Medical History  Diagnosis Date  . Diabetes mellitus   . Pneumonia     history  . Kidney stones     histroy  . Hx: UTI (urinary tract infection)   . CVA (cerebral  infarction) 09/2009  . Breast cancer 05/05/12    left, ER+/PR-, Her 2 -  . Leukemia     CLL, Dr Mariel Sleet, completed tx 2010  . COPD (chronic obstructive pulmonary disease)   . Hypothyroidism   . Stroke     x 3, last Nov 2011, right side weakness  . Neuropathy     diabetic- right side   Past Surgical History  Procedure Laterality Date  . Cardiac catheterization    . Lithotripsy    . Breast lumpectomy      left  breast  . Replacement total knee      right  . Appendectomy    . Ovarian cyst removal    . Total hip arthroplasty      left  . Bartholin gland cyst excision    . Total hip arthroplasty      right  . Cholecystectomy    . Abdominal hysterectomy      TAH-BSO, endometriosis  . Core biopsy left breast  04/2012   Social History:  reports that she quit smoking about 20 years ago. She has never used smokeless tobacco. She reports that  drinks alcohol. She reports that she does not use illicit drugs. Patient resides in assisted living. She is normally able to ambulate but use a walker or cane.  Allergies  Allergen Reactions  . Cephalexin Anaphylaxis and Rash    Slurred speech, unconsciousness  . Levaquin (Levofloxacin) Anaphylaxis    Doesn't remember  . Cranberry Juice Powder   . Avelox (Moxifloxacin Hcl In Nacl) Rash  . Ciprofloxacin Rash  . Codeine Rash  .  Ibuprofen Rash  . Lipitor (Atorvastatin Calcium) Rash  . Macrodantin Rash  . Morphine Sulfate Rash  . Nitrofurantoin Monohyd Macro Rash  . Paroxetine Hcl Rash  . Penicillins Rash  . Salicylic Acid Rash  . Sulfa Antibiotics Rash  . Vioxx (Rofecoxib) Rash    Family History  Problem Relation Age of Onset  . Diabetes    . Kidney disease    . Cancer Father     stomach  . Kidney disease Brother   . Cancer Brother     throat    Prior to Admission medications   Medication Sig Start Date End Date Taking? Authorizing Provider  Cholecalciferol (VITAMIN D PO) Take 1 tablet by mouth at bedtime.   Yes Historical  Provider, MD  metFORMIN (GLUCOPHAGE) 500 MG tablet Take 500 mg by mouth 2 (two) times daily.     Yes Historical Provider, MD  anastrozole (ARIMIDEX) 1 MG tablet Take 1 tablet (1 mg total) by mouth daily. 06/17/12   Sherral Hammers, MD  Calcium Carbonate-Vitamin D (CALTRATE 600+D) 600-400 MG-UNIT per tablet Take 1 tablet by mouth 2 (two) times daily. 06/17/12   Sherral Hammers, MD  HYDROcodone-acetaminophen (NORCO/VICODIN) 5-325 MG per tablet Take 1 tablet by mouth every 6 (six) hours as needed for pain.    Historical Provider, MD   Physical Exam: Filed Vitals:   06/17/12 1650  BP: 148/71  Pulse: 91  Temp: 97.8 F (36.6 C)  TempSrc: Oral  Resp: 16  Height: 5\' 6"  (1.676 m)  Weight: 79.425 kg (175 lb 1.6 oz)  SpO2: 96%     General:  Alert and oriented x3, no acute distress, fatigued, looks younger than stated age  Eyes: Sclera nonicteric, extraocular movements are intact  ENT: Normocephalic, atraumatic, mucous membranes are slightly dry  Neck: Supple  Cardiovascular: Regular rate and rhythm, S1-S2  Respiratory: Scattered rales plus bilateral expiratory wheezing  Abdomen: Soft, nontender, nondistended, positive bowel sounds  Skin: No skin breaks, tears or lesions  Musculoskeletal: No clubbing or cyanosis or edema  Psychiatric: Patient is appropriate, no evidence of psychoses  Neurologic: No overt deficits  Labs on Admission:  Basic Metabolic Panel:  Recent Labs Lab 06/17/12 1158  NA 137  K 3.4*  CL 98  CO2 25  GLUCOSE 145*  BUN 20  CREATININE 1.18*  CALCIUM 9.1   Liver Function Tests:  Recent Labs Lab 06/17/12 1158  AST 13  ALT 6  ALKPHOS 59  BILITOT 0.7  PROT 6.6  ALBUMIN 3.7   CBC:  Recent Labs Lab 06/17/12 1158  WBC 18.5*  NEUTROABS 9.6*  HGB 13.5  HCT 38.7  MCV 94.9  PLT 125*    Radiological Exams on Admission: Dg Chest 2 View  06/17/2012     IMPRESSION: COPD.  Right basilar airspace opacity could reflect pneumonia.   Original Report  Authenticated By: Charlett Nose, M.D.      Assessment/Plan Principal Problem:   CAP (community acquired pneumonia): We'll go with IV Zithromax plus try oral Augmentin. Patient has multiple allergies listed. She is reported high anaphylaxis allergies to fluoroquinolones and Keflex and low severity allergies to sulfa, Macrodantin, penicillin. Oxygen as well. Suspect there may be an underlying component of COPD exacerbation to this. See below. Active Problems:   CLL:: Stable. Oncology following as outpatient. Suspect some of her leukocytosis may be attributed to this.   Diabetes insipidus   HYPERLIPIDEMIA:    CVA   COPD Diabetes? A1c in March of 2012 was  Code Status: Patient is undecided. He gave him information on how CPR works. For now..  Family Communication: Plan discussed with patient, her son and daughter and her son's children at the bedside.  Disposition Plan: Back to assisted living in hopefully just a few days  Time spent: 40 minutes  Hollice Espy [facility] Pager [phone]  If 7PM-7AM, please contact night-coverage [email or web address] Password [facility] 06/17/2012, 7:04 PM

## 2012-06-17 NOTE — Progress Notes (Signed)
ANTIBIOTIC CONSULT NOTE - INITIAL  Pharmacy Consult for Renal Adjustment Antibiotics Indication: pneumonia  Allergies  Allergen Reactions  . Cephalexin Anaphylaxis and Rash    Slurred speech, unconsciousness  . Levaquin (Levofloxacin) Anaphylaxis    Doesn't remember  . Cranberry Juice Powder   . Avelox (Moxifloxacin Hcl In Nacl) Rash  . Ciprofloxacin Rash  . Codeine Rash  . Ibuprofen Rash  . Lipitor (Atorvastatin Calcium) Rash  . Macrodantin Rash  . Morphine Sulfate Rash  . Nitrofurantoin Monohyd Macro Rash  . Paroxetine Hcl Rash  . Penicillins Rash  . Salicylic Acid Rash  . Sulfa Antibiotics Rash  . Vioxx (Rofecoxib) Rash    Patient Measurements: Height: 5\' 6"  (167.6 cm) Weight: 175 lb 1.6 oz (79.425 kg) IBW/kg (Calculated) : 59.3   Vital Signs: Temp: 98.2 F (36.8 C) (06/05 2024) Temp src: Axillary (06/05 2024) BP: 112/62 mmHg (06/05 2024) Pulse Rate: 89 (06/05 2024) Intake/Output from previous day:   Intake/Output from this shift:    Labs:  Recent Labs  06/17/12 1158  WBC 18.5*  HGB 13.5  PLT 125*  CREATININE 1.18*   Estimated Creatinine Clearance: 37.7 ml/min (by C-G formula based on Cr of 1.18). No results found for this basename: VANCOTROUGH, VANCOPEAK, VANCORANDOM, GENTTROUGH, GENTPEAK, GENTRANDOM, TOBRATROUGH, TOBRAPEAK, TOBRARND, AMIKACINPEAK, AMIKACINTROU, AMIKACIN,  in the last 72 hours   Microbiology: No results found for this or any previous visit (from the past 720 hour(s)).  Medical History: Past Medical History  Diagnosis Date  . Diabetes mellitus   . Pneumonia     history  . Kidney stones     histroy  . Hx: UTI (urinary tract infection)   . CVA (cerebral infarction) 09/2009  . Breast cancer 05/05/12    left, ER+/PR-, Her 2 -  . Leukemia     CLL, Dr Mariel Sleet, completed tx 2010  . COPD (chronic obstructive pulmonary disease)   . Hypothyroidism   . Stroke     x 3, last Nov 2011, right side weakness  . Neuropathy     diabetic-  right side    Medications:  Scheduled:  . ipratropium  0.5 mg Nebulization Q4H   And  . albuterol  2.5 mg Nebulization Q4H  . amoxicillin-clavulanate  1 tablet Oral Q12H  . azithromycin  500 mg Intravenous Q24H  . calcium-vitamin D  1 tablet Oral BID  . [START ON 06/18/2012] insulin aspart  0-9 Units Subcutaneous TID WC  . [START ON 06/18/2012] metFORMIN  500 mg Oral BID WC  . predniSONE  40 mg Oral BID WC  . sodium chloride  3 mL Intravenous Q12H   Assessment: Azithromycin 500 mg IV every 24 hours Augmentin 875/125 po every 12 hours CrCl approximately 37.7 ml/min Multiple allergies  Goal of Therapy:  Eradicate Infection   Plan:  Continue antibiotics as ordered No renal adjustment needed at this time Continue to monitor renal function Labs per protocol  Josephine Igo 06/17/2012,9:09 PM

## 2012-06-18 DIAGNOSIS — E119 Type 2 diabetes mellitus without complications: Secondary | ICD-10-CM

## 2012-06-18 DIAGNOSIS — C9110 Chronic lymphocytic leukemia of B-cell type not having achieved remission: Secondary | ICD-10-CM

## 2012-06-18 LAB — TSH: TSH: 2.103 u[IU]/mL (ref 0.350–4.500)

## 2012-06-18 LAB — GLUCOSE, CAPILLARY
Glucose-Capillary: 183 mg/dL — ABNORMAL HIGH (ref 70–99)
Glucose-Capillary: 181 mg/dL — ABNORMAL HIGH (ref 70–99)
Glucose-Capillary: 253 mg/dL — ABNORMAL HIGH (ref 70–99)

## 2012-06-18 LAB — CBC
Hemoglobin: 12.6 g/dL (ref 12.0–15.0)
MCHC: 35.2 g/dL (ref 30.0–36.0)
WBC: 15 10*3/uL — ABNORMAL HIGH (ref 4.0–10.5)

## 2012-06-18 MED ORDER — ALBUTEROL SULFATE (5 MG/ML) 0.5% IN NEBU: 2.5000 mg | INHALATION_SOLUTION | RESPIRATORY_TRACT | Status: DC

## 2012-06-18 MED ORDER — PREDNISONE 20 MG PO TABS
30.0000 mg | ORAL_TABLET | Freq: Two times a day (BID) | ORAL | Status: DC
  Administered 2012-06-18 – 2012-06-19 (×2): 30 mg via ORAL
  Filled 2012-06-18 (×2): qty 1

## 2012-06-18 MED ORDER — ALBUTEROL SULFATE (5 MG/ML) 0.5% IN NEBU
2.5000 mg | INHALATION_SOLUTION | RESPIRATORY_TRACT | Status: DC
  Administered 2012-06-19 (×2): 2.5 mg via RESPIRATORY_TRACT
  Filled 2012-06-18 (×3): qty 0.5

## 2012-06-18 MED ORDER — IPRATROPIUM BROMIDE 0.02 % IN SOLN: 0.5000 mg | RESPIRATORY_TRACT | Status: DC

## 2012-06-18 MED ORDER — IPRATROPIUM BROMIDE 0.02 % IN SOLN
0.5000 mg | RESPIRATORY_TRACT | Status: DC
  Administered 2012-06-19 (×2): 0.5 mg via RESPIRATORY_TRACT
  Filled 2012-06-18 (×3): qty 2.5

## 2012-06-18 NOTE — Progress Notes (Signed)
[facility] PROGRESS NOTE  [name] ZOX:[id number] DOB: [birth year] DOA: 06/17/2012 PCP: Colette Ribas, MD  Assessment/Plan: Principal Problem:   CAP (community acquired pneumonia): Given multiple drug allergies, treating with IV Zithromax plus Augmentin and seeming to respond. Once white blood cell count normalized, change Zithromax to by mouth and plan for discharge. Active Problems:   Diabetes insipidus   HYPERLIPIDEMIA: Stable    CVA-history of: Stable    DM type 2 (diabetes mellitus, type 2): Sliding scale plus metformin. CBGs at 180s    COPD with exacerbation: Continue nebulizers plus oxygen plus antibiotics plus steroids. Continue prednisone taper. May need to be on chronic inhaler on discharge   Code Status: Full code Family Communication: Spoke with daughter by phone today.  Disposition Plan: Back to assisted living, likely tomorrow   Consultants:  None  Procedures:  None  Antibiotics:  By mouth Augmentin plus IV Rocephin-day 2  HPI/Subjective: Patient feeling much better today. Decreased cough. No shortness of breath.  Objective: Filed Vitals:   06/18/12 0420 06/18/12 0708 06/18/12 0908 06/18/12 1050  BP: 103/65  108/68   Pulse: 89  98   Temp: 97.4 F (36.3 C)  98.2 F (36.8 C)   TempSrc: Oral  Oral   Resp: 16  18   Height:      Weight:      SpO2: 94% 95% 100% 97%    Intake/Output Summary (Last 24 hours) at 06/18/12 1410 Last data filed at 06/18/12 1249  Gross per 24 hour  Intake   1083 ml  Output      0 ml  Net   1083 ml   Filed Weights   06/17/12 1650  Weight: 79.425 kg (175 lb 1.6 oz)    Exam:   General:  Alert and oriented x3, no acute distress  Cardiovascular: Regular rate and rhythm, S1-S2  Respiratory: Decreased breath sounds throughout, minimal end expiratory wheeze  Abdomen: Soft, nontender, nondistended, positive bowel sounds  Musculoskeletal: No clubbing or cyanosis or edema   Data Reviewed: Basic  Metabolic Panel:  Recent Labs Lab 06/17/12 1158  NA 137  K 3.4*  CL 98  CO2 25  GLUCOSE 145*  BUN 20  CREATININE 1.18*  CALCIUM 9.1   Liver Function Tests:  Recent Labs Lab 06/17/12 1158  AST 13  ALT 6  ALKPHOS 59  BILITOT 0.7  PROT 6.6  ALBUMIN 3.7   CBC:  Recent Labs Lab 06/17/12 1158 06/18/12 0455  WBC 18.5* 15.0*  NEUTROABS 9.6*  --   HGB 13.5 12.6  HCT 38.7 35.8*  MCV 94.9 94.0  PLT 125* 104*   BNP (last 3 results)  Recent Labs  06/17/12 1902  PROBNP 271.0   CBG:  Recent Labs Lab 06/17/12 2104 06/18/12 0747 06/18/12 1126  GLUCAP 155* 183* 181*    Recent Results (from the past 240 hour(s))  CULTURE, BLOOD (ROUTINE X 2)     Status: None   Collection Time    06/17/12  7:03 PM      Result Value Range Status   Specimen Description RIGHT ANTECUBITAL   Final   Special Requests BOTTLES DRAWN AEROBIC AND ANAEROBIC 15CC EACH   Final   Culture NO GROWTH 1 DAY   Final   Report Status PENDING   Incomplete  CULTURE, BLOOD (ROUTINE X 2)     Status: None   Collection Time    06/17/12  7:10 PM      Result Value Range Status  Specimen Description LEFT ANTECUBITAL   Final   Special Requests BOTTLES DRAWN AEROBIC AND ANAEROBIC 8CC EACH   Final   Culture NO GROWTH 1 DAY   Final   Report Status PENDING   Incomplete     Studies: Dg Chest 2 View  06/17/2012     IMPRESSION: COPD.  Right basilar airspace opacity could reflect pneumonia.   Original Report Authenticated By: Charlett Nose, M.D.    Scheduled Meds: . ipratropium  0.5 mg Nebulization Q4H   And  . albuterol  2.5 mg Nebulization Q4H  . amoxicillin-clavulanate  1 tablet Oral Q12H  . azithromycin  500 mg Intravenous Q24H  . calcium-vitamin D  1 tablet Oral BID  . insulin aspart  0-9 Units Subcutaneous TID WC  . metFORMIN  500 mg Oral BID WC  . predniSONE  40 mg Oral BID WC  . sodium chloride  3 mL Intravenous Q12H   Continuous Infusions:   Principal Problem:   CAP (community acquired  pneumonia) Active Problems:   Diabetes insipidus   HYPERLIPIDEMIA   CVA   DM type 2 (diabetes mellitus, type 2)   COPD with exacerbation    Time spent: 20 min    Hollice Espy  [facility] Pager [phone]. If 7PM-7AM, please contact night-coverage at [email or web address], password [facility] 06/18/2012, 2:10 PM  LOS: 1 day

## 2012-06-18 NOTE — Care Management Note (Unsigned)
    Page 1 of 1   06/18/2012     10:14:56 AM   CARE MANAGEMENT NOTE 06/18/2012  Patient:  [name], [name]   Account Number:  [id number]  Date Initiated:  06/18/2012  Documentation initiated by:  Sharrie Rothman  Subjective/Objective Assessment:   Pt admitted from [facility]. Pt would like to inquire about bed at [facility] for discharge.     Action/Plan:   CSW is aware of pts desire to move to [facility]. CSW will arrange discharge to facility when medically stable.   Anticipated DC Date:  06/20/2012   Anticipated DC Plan:  ASSISTED LIVING / REST HOME  In-house referral  Clinical Social Worker      DC Planning Services  CM consult      Choice offered to / List presented to:             Status of service:  Completed, signed off Medicare Important Message given?   (If response is "NO", the following Medicare IM given date fields will be blank) Date Medicare IM given:   Date Additional Medicare IM given:    Discharge Disposition:    Per UR Regulation:    If discussed at Long Length of Stay Meetings, dates discussed:    Comments:  06/18/12 1015 Arlyss Queen, RN BSN CM

## 2012-06-18 NOTE — Progress Notes (Signed)
UR chart review completed.  

## 2012-06-18 NOTE — Progress Notes (Signed)
06/18/12 1255 Patient assisted up to chair this afternoon for lunch. Tolerates sitting up well, chair alarm on for safety. Call light and phone within reach. Instructed to call for assistance and not attempt getting up on her own for safety. States will call. Earnstine Regal, RN

## 2012-06-18 NOTE — Clinical Social Work Psychosocial (Signed)
Clinical Social Work Department BRIEF PSYCHOSOCIAL ASSESSMENT 06/18/2012  Patient:  [name], [name]     Account Number:  [id number]     Admit date:  06/17/2012  Clinical Social Worker:  Nancie Neas  Date/Time:  06/18/2012 11:40 AM  Referred by:  Physician  Date Referred:  06/18/2012 Referred for  ALF Placement   Other Referral:   Interview type:  Patient Other interview type:   and daughter- [name]    PSYCHOSOCIAL DATA Living Status:  FACILITY Admitted from facility:  OTHER Level of care:  Assisted Living Primary support name:  [name] Primary support relationship to patient:  CHILD, ADULT Degree of support available:   supportive    CURRENT CONCERNS Current Concerns  Post-Acute Placement   Other Concerns:    SOCIAL WORK ASSESSMENT / PLAN CSW met with pt at bedside. Pt alert and oriented and reports she has been a resident at [facility] since September of last year. Pt admitted due to pneumonia. She reports her daughter, [name] is her POA and best support. Pt had originally looked at [facility] and no private rooms were available. She went to [facility] and has been very happy there, but just would like to be in [place] to be closer to family. CSW spoke with [facility] and [facility] at pt and daughter's request. Neither have private rooms available at this time. Pt and [name] are agreeable to return to [facility]. Per Zella Ball at facility, pt was recently diagnosed with breast cancer and will start chemo soon. She is a very limited assist, and ambulates using a cane or walker. Facility has in house home health if needed at d/c. Anticipate weekend return and facility is agreeable to this.   Assessment/plan status:  Psychosocial Support/Ongoing Assessment of Needs Other assessment/ plan:   Information/referral to community resources:   [facility]  [employer]    PATIENT'S/FAMILY'S RESPONSE TO PLAN OF CARE: Pt and daughter report positive feelings regarding return to [facility]. CSW will continue to follow.       Derenda Fennel, [place] [phone]

## 2012-06-18 NOTE — Progress Notes (Signed)
Nutrition Brief Note  Patient identified on the Malnutrition Screening Tool (MST) Report  Body mass index is 28.28 kg/(m^2). Patient meets criteria for overweight based on current BMI.   Current diet order is Regular, patient is consumed approximately 100% of lunch today. Labs and medications reviewed. She will be returning to [facility] at discharge. Possibly over the weekend?   No nutrition interventions warranted at this time. If nutrition issues arise, please consult RD.   Royann Shivers MS,RD,LDN,CSG Office: [phone] Pager: [phone]

## 2012-06-19 LAB — CBC
HCT: 36.2 % (ref 36.0–46.0)
MCHC: 35.4 g/dL (ref 30.0–36.0)
MCV: 93.8 fL (ref 78.0–100.0)
RDW: 13.4 % (ref 11.5–15.5)

## 2012-06-19 LAB — BASIC METABOLIC PANEL
BUN: 19 mg/dL (ref 6–23)
CO2: 26 mEq/L (ref 19–32)
Calcium: 9.9 mg/dL (ref 8.4–10.5)
Chloride: 101 mEq/L (ref 96–112)
Creatinine, Ser: 1.05 mg/dL (ref 0.50–1.10)
GFR calc Af Amer: 55 mL/min — ABNORMAL LOW (ref >90)

## 2012-06-19 LAB — GLUCOSE, CAPILLARY: Glucose-Capillary: 159 mg/dL — ABNORMAL HIGH (ref 70–99)

## 2012-06-19 MED ORDER — ALBUTEROL SULFATE (5 MG/ML) 0.5% IN NEBU: 2.5000 mg | INHALATION_SOLUTION | RESPIRATORY_TRACT | Status: DC | PRN

## 2012-06-19 MED ORDER — AMOXICILLIN-POT CLAVULANATE ER 1000-62.5 MG PO TB12: 2.0000 | ORAL_TABLET | Freq: Two times a day (BID) | ORAL | Status: DC

## 2012-06-19 MED ORDER — PREDNISONE 10 MG PO TABS: ORAL_TABLET | ORAL | Status: DC

## 2012-06-19 MED ORDER — AZITHROMYCIN 500 MG PO TABS: 500.0000 mg | ORAL_TABLET | Freq: Every day | ORAL | Status: DC

## 2012-06-19 MED ORDER — ALBUTEROL SULFATE HFA 108 (90 BASE) MCG/ACT IN AERS: 2.0000 | INHALATION_SPRAY | Freq: Four times a day (QID) | RESPIRATORY_TRACT | Status: AC | PRN

## 2012-06-19 NOTE — Progress Notes (Signed)
06/19/12 1913 Received call from [facility] this afternoon regarding concerns with d/c summary and FL2. Stated patient had new prescriptions for Arimidex and scheduled acetaminophen prior to admission, but had not started taking them yet. Stated medications must be added to d/c summary and FL2 so she could start them.  Also stated needed to receive azithromycin prescription. Received MAR from [facility] and notified Dr. Rito Ehrlich of request to update medications. Updated d/c summary and azithromycin prescription faxed to [facility] as requested this evening. Notified on call social worker of need for updated FL2. Stated it could be updated in the morning by on call social worker. Spoke with [facility] this evening, stated received d/c summary and prescription. Earnstine Regal, RN

## 2012-06-19 NOTE — Progress Notes (Signed)
06/19/12 1331 Patient ambulated in hallway this morning with nurse tech supervision, tolerated well. Pt up to chair this morning tolerated well. Ambulated in hallway this afternoon with NT supervision. O2 sats 96-100% during ambulation. No c/o shortness of breath during activity. Some general weakness, pt has cane for home use at bedside. Earnstine Regal, RN

## 2012-06-19 NOTE — Progress Notes (Signed)
06/19/12 1443 Notified [facility] of discharge orders for today, stated okay for patient to return today. Reviewed discharge instructions with patient and son at bedside. Copy of instructions, medication list, prescriptions, f/u appointment information, education sheets for pneumonia reviewed with patient and sent with discharge packet for facility. FL2 updated per social work and copy sent with patient. Patient and son verbalized understanding of instructions. IV site d/c'd and within normal limits. Pt has cane for use at [facility]. No c/o pain or other discomfort at time of discharge. Pt left floor in stable condition via w/c accompanied by nurse tech. Son to transport her back to [facility].  Earnstine Regal, RN

## 2012-06-19 NOTE — Discharge Summary (Addendum)
Physician Discharge Summary  [name] NFA:[id number] DOB: 11-15-1926 DOA: 06/17/2012  PCP: Colette Ribas, MD  Admit date: 06/17/2012 Discharge date: 06/19/2012  Time spent: 25 minutes  Recommendations for Outpatient Follow-up:  1. Patient is being discharged on 5 more days of antibiotics for total 7 days of therapy  Discharge Diagnoses:  Principal Problem:   CAP (community acquired pneumonia) Active Problems:   Diabetes insipidus   HYPERLIPIDEMIA   CVA   DM type 2 (diabetes mellitus, type 2)   COPD with exacerbation   Discharge Condition: Improved, being discharged home  Diet recommendation: Carb modified low sodium  Filed Weights   06/17/12 1650  Weight: 79.425 kg (175 lb 1.6 oz)    History of present illness:  On 6/5: [name] is a 79 y.o. female  Past medical history of COPD, CLL and diabetes who presented to her oncologist today for routine checkup. She noted that she been feeling run down in the last few weeks and noted to oncologist that she was complaining of some shortness of breath with productive cough. On exam she was noted to have some left basilar crackles, so a chest x-ray was ordered. Lab work done the oncology office noted ALT white blood cell count, however in comparison to previous labs she may have chronic leukocytosis from CLL. Patient was found to have a suspected infiltrate and given her severe multiple antibiotic allergies plus symptoms, it was felt best she come in for treatment. Oncology discussed the case with hospitalists and arrangements were made for per patient to come in as a direct admission. After arrival to floor, the patient was interviewed. She gives a history of feeling run down for the last few weeks with a productive cough. She states that she's noted some wheezing, especially with exertion.   Hospital Course:  Principal Problem:   CAP (community acquired pneumonia): Patient antibiotic choices were limited because of noted  severe possible anaphylaxis allergies to cephalosporins and fluoroquinolones. She reportedly has taken Zithromax in the past saw her a we'll start a course of IV Zithromax and by mouth Augmentin. She reportedly has a penicillin allergy but it is low in symptoms and the patient had no problems taking this in the hospital. Is also concerned of some underlying COPD. BNP was checked and found to be normal. By hospital day 2, patient had responded well to nebulizer treatments, oxygen, antibiotics and steroids. Steroids were tapered down and by hospital day 3, patient was breathing comfortably and able to ambulate on room air with use of a walker. She is also back at her baseline. She's been discharged on 5 more days of Zithromax by mouth plus Augmentin. Active Problems:    HYPERLIPIDEMIA: Stable medical issue during this hospitalization.    CVA: Stable medical issue    DM type 2 (diabetes mellitus, type 2): Patient has some slightly elevated blood sugars during her hospitalization likely secondary to steroids. She's been listed have diabetes in the past, however an A1c checked during this hospitalization was normal at 5.6. Do not feel that she does have diabetes.    COPD with exacerbation: Some component of her pneumonia was from COPD. Patient received nebulizers while she was in hospital for steroid. We'll discharge her on steroid taper plus when necessary albuterol.  History of CLL: Unclear if the patient's underlying white blood cell count baseline, she did respond initially to antibiotics going from 18 down to 15 and then back up to 20 today. Feel this is less  likely from persistent infection or CLL and may be more from her steroids.  Procedures:  None  Consultations:  None  Discharge Exam: Filed Vitals:   06/19/12 0637 06/19/12 0743 06/19/12 1134 06/19/12 1315  BP: 144/66     Pulse: 98     Temp: 97.7 F (36.5 C)     TempSrc:      Resp: 20     Height:      Weight:      SpO2: 97% 95%  95% 96%    General: Alert and oriented x3, no acute distress Cardiovascular: Regular rate and rhythm, S1-S2 Respiratory: Clear to auscultation bilaterally  Discharge Instructions  Discharge Orders   Future Appointments Provider Department Dept Phone   07/20/2012 2:30 PM Ap-Acapa Chair [facility] [phone]   07/20/2012 3:00 PM Claudia Desanctis [facility] [phone]   Future Orders Complete By Expires     Diet - low sodium heart healthy  As directed     Increase activity slowly  As directed         Medication List    TAKE these medications       albuterol 108 (90 BASE) MCG/ACT inhaler  Commonly known as:  PROVENTIL HFA;VENTOLIN HFA  Inhale 2 puffs into the lungs every 6 (six) hours as needed for wheezing.     amoxicillin-clavulanate 1000-62.5 MG per tablet  Commonly known as:  AUGMENTIN XR  Take 2 tablets by mouth 2 (two) times daily.     Calcium Carbonate-Vitamin D 600-400 MG-UNIT per tablet  Commonly known as:  CALTRATE 600+D  Take 1 tablet by mouth 2 (two) times daily.     HYDROcodone-acetaminophen 5-325 MG per tablet  Commonly known as:  NORCO/VICODIN  Take 1 tablet by mouth every 6 (six) hours as needed for pain.     metFORMIN 500 MG tablet  Commonly known as:  GLUCOPHAGE  Take 500 mg by mouth 2 (two) times daily.     predniSONE 10 MG tablet  Commonly known as:  DELTASONE  20 mg po bid x 1 day, then 10mg  po bid x 1 day, then stop.     VITAMIN D PO  Take 1 tablet by mouth daily.    azithromycin 500 mg tablet Commonly known as: Zithromax Take 500 mg by mouth daily x5 days   Arimidex 1mg  tablet Take 1mg  by mouth daily  Acetaminophen 325mg  tablet Commenly known as: Tylenol Take 650mg  (2 tablets) by mouth 3 times a day Allergies  Allergen Reactions  . Cephalexin Anaphylaxis and Rash    Slurred speech, unconsciousness  . Levaquin (Levofloxacin) Anaphylaxis    Doesn't remember  . Cranberry Juice Powder   . Avelox  (Moxifloxacin Hcl In Nacl) Rash  . Ciprofloxacin Rash  . Codeine Rash  . Ibuprofen Rash  . Lipitor (Atorvastatin Calcium) Rash  . Macrodantin Rash  . Morphine Sulfate Rash  . Nitrofurantoin Monohyd Macro Rash  . Paroxetine Hcl Rash  . Penicillins Rash  . Salicylic Acid Rash  . Sulfa Antibiotics Rash  . Vioxx (Rofecoxib) Rash       Follow-up Information   Follow up with Colette Ribas, MD In 1 month.   Contact information:   [address] [place] [address] [phone]       Follow up with Erline Hau, C, MD In 2 weeks.   Contact information:   [address] [place] [address] [phone]  The results of significant diagnostics from this hospitalization (including imaging, microbiology, ancillary and laboratory) are listed below for reference.    Significant Diagnostic Studies: Dg Chest 2 View  06/17/2012  IMPRESSION: COPD.  Right basilar airspace opacity could reflect pneumonia.   Original Report Authenticated By: Charlett Nose, M.D.     Microbiology: Recent Results (from the past 240 hour(s))  CULTURE, BLOOD (ROUTINE X 2)     Status: None   Collection Time    06/17/12  7:03 PM      Result Value Range Status   Specimen Description RIGHT ANTECUBITAL   Final   Special Requests BOTTLES DRAWN AEROBIC AND ANAEROBIC 15CC EACH   Final   Culture NO GROWTH 1 DAY   Final   Report Status PENDING   Incomplete  CULTURE, BLOOD (ROUTINE X 2)     Status: None   Collection Time    06/17/12  7:10 PM      Result Value Range Status   Specimen Description LEFT ANTECUBITAL   Final   Special Requests BOTTLES DRAWN AEROBIC AND ANAEROBIC 8CC EACH   Final   Culture NO GROWTH 1 DAY   Final   Report Status PENDING   Incomplete     Labs: Basic Metabolic Panel:  Recent Labs Lab 06/17/12 1158 06/19/12 0640  NA 137 138  K 3.4* 4.0  CL 98 101  CO2 25 26  GLUCOSE 145* 186*  BUN 20 19  CREATININE 1.18* 1.05  CALCIUM 9.1 9.9    Liver Function Tests:  Recent Labs Lab 06/17/12 1158  AST 13  ALT 6  ALKPHOS 59  BILITOT 0.7  PROT 6.6  ALBUMIN 3.7   CBC:  Recent Labs Lab 06/17/12 1158 06/18/12 0455 06/19/12 0640  WBC 18.5* 15.0* 20.1*  NEUTROABS 9.6*  --   --   HGB 13.5 12.6 12.8  HCT 38.7 35.8* 36.2  MCV 94.9 94.0 93.8  PLT 125* 104* 153   BNP: BNP (last 3 results)  Recent Labs  06/17/12 1902  PROBNP 271.0   CBG:  Recent Labs Lab 06/18/12 1624 06/18/12 2043 06/19/12 0718 06/19/12 1103 06/19/12 1218  GLUCAP 253* 236* 159* 144* 159*       Signed:  Akiera Allbaugh K  [facility] 06/19/2012, 1:44 PM

## 2012-06-21 LAB — LEGIONELLA ANTIGEN, URINE: Legionella Antigen, Urine: NEGATIVE

## 2012-06-22 LAB — CULTURE, BLOOD (ROUTINE X 2)
Culture: NO GROWTH
Culture: NO GROWTH

## 2012-07-20 ENCOUNTER — Encounter (HOSPITAL_BASED_OUTPATIENT_CLINIC_OR_DEPARTMENT_OTHER): Payer: Medicare Other

## 2012-07-20 ENCOUNTER — Encounter (HOSPITAL_COMMUNITY): Payer: Medicare Other | Attending: Hematology and Oncology | Admitting: Oncology

## 2012-07-20 ENCOUNTER — Encounter (HOSPITAL_COMMUNITY): Payer: Self-pay | Admitting: Oncology

## 2012-07-20 DIAGNOSIS — R05 Cough: Secondary | ICD-10-CM | POA: Insufficient documentation

## 2012-07-20 DIAGNOSIS — Z170 Estrogen receptor positive status [ER+]: Secondary | ICD-10-CM

## 2012-07-20 DIAGNOSIS — R059 Cough, unspecified: Secondary | ICD-10-CM | POA: Insufficient documentation

## 2012-07-20 DIAGNOSIS — M810 Age-related osteoporosis without current pathological fracture: Secondary | ICD-10-CM

## 2012-07-20 DIAGNOSIS — C50912 Malignant neoplasm of unspecified site of left female breast: Secondary | ICD-10-CM

## 2012-07-20 DIAGNOSIS — C50919 Malignant neoplasm of unspecified site of unspecified female breast: Secondary | ICD-10-CM

## 2012-07-20 LAB — CBC WITH DIFFERENTIAL/PLATELET
Basophils Relative: 0 % (ref 0–1)
Eosinophils Relative: 2 % (ref 0–5)
Hemoglobin: 12.6 g/dL (ref 12.0–15.0)
MCH: 33.2 pg (ref 26.0–34.0)
MCV: 98.2 fL (ref 78.0–100.0)
Monocytes Absolute: 0.4 10*3/uL (ref 0.1–1.0)
Monocytes Relative: 4 % (ref 3–12)
Neutrophils Relative %: 31 % — ABNORMAL LOW (ref 43–77)
Platelets: 144 10*3/uL — ABNORMAL LOW (ref 150–400)
RBC: 3.79 MIL/uL — ABNORMAL LOW (ref 3.87–5.11)
WBC: 10.6 10*3/uL — ABNORMAL HIGH (ref 4.0–10.5)

## 2012-07-20 LAB — COMPREHENSIVE METABOLIC PANEL
ALT: 10 U/L (ref 0–35)
AST: 13 U/L (ref 0–37)
Alkaline Phosphatase: 54 U/L (ref 39–117)
CO2: 29 mEq/L (ref 19–32)
Chloride: 103 mEq/L (ref 96–112)
GFR calc Af Amer: 44 mL/min — ABNORMAL LOW (ref >90)
GFR calc non Af Amer: 38 mL/min — ABNORMAL LOW (ref >90)
Glucose, Bld: 259 mg/dL — ABNORMAL HIGH (ref 70–99)
Sodium: 139 mEq/L (ref 135–145)
Total Bilirubin: 0.2 mg/dL — ABNORMAL LOW (ref 0.3–1.2)

## 2012-07-20 MED ORDER — ZOLEDRONIC ACID 5 MG/100ML IV SOLN
INTRAVENOUS | Status: AC
  Filled 2012-07-20: qty 100

## 2012-07-20 MED ORDER — ZOLEDRONIC ACID 5 MG/100ML IV SOLN
5.0000 mg | Freq: Once | INTRAVENOUS | Status: AC
  Administered 2012-07-20: 5 mg via INTRAVENOUS

## 2012-07-20 NOTE — Patient Instructions (Addendum)
.  [facility] Discharge Instructions  RECOMMENDATIONS MADE BY THE CONSULTANT AND ANY TEST RESULTS WILL BE SENT TO YOUR REFERRING PHYSICIAN.  EXAM FINDINGS BY THE PHYSICIAN TODAY AND SIGNS OR SYMPTOMS TO REPORT TO CLINIC OR PRIMARY PHYSICIAN: Exam today per [name]. Lesion in breast appears to be shrinking   INSTRUCTIONS GIVEN AND DISCUSSED: Continue aromasin reclast will be once a year SPECIAL INSTRUCTIONS/FOLLOW-UP: We will see you back in 3 months  Thank you for choosing [name] to provide your oncology and hematology care.  To afford each patient quality time with our providers, please arrive at least 15 minutes before your scheduled appointment time.  With your help, our goal is to use those 15 minutes to complete the necessary work-up to ensure our physicians have the information they need to help with your evaluation and healthcare recommendations.    Effective January 1st, 2014, we ask that you re-schedule your appointment with our physicians should you arrive 10 or more minutes late for your appointment.  We strive to give you quality time with our providers, and arriving late affects you and other patients whose appointments are after yours.    Again, thank you for choosing [facility].  Our hope is that these requests will decrease the amount of time that you wait before being seen by our physicians.       _____________________________________________________________  Should you have questions after your visit to [facility], please contact our office at [phone] between the hours of 8:30 a.m. and 5:00 p.m.  Voicemails left after 4:30 p.m. will not be returned until the following business day.  For prescription refill requests, have your pharmacy contact our office with your prescription refill request.

## 2012-07-20 NOTE — Progress Notes (Signed)
Specimen obtained peripherally from right arm for labs.  Tolerated well.at 1445 Tolerated Reclast 5 mg IV well.

## 2012-07-20 NOTE — Progress Notes (Signed)
Carla Ribas, MD [address] [place] [address]  Invasive ductal carcinoma of left breast - Plan: CBC with Differential, Comprehensive metabolic panel  Osteoporosis, unspecified - Plan: DISCONTINUED: zoledronic acid (RECLAST) injection 5 mg  CURRENT THERAPY: Neoadjuvant Arimidex 1 mg daily beginning on 06/17/2012  INTERVAL HISTORY: [name] 79 y.o. female returns for  regular  visit for followup of Clinical stage IIA (T2, N0, M0) invasive ductal carcinoma  ER 100%, PR 0%, Ki-67 32%, Her2 not amplified who was started neoadjuvantly on Arimidex 1 mg on 06/17/2012 daily by Dr. Jake Samples for ~6 months to decrease tumor burden in hopes of making improving surgical resection outcome.  Her surgeon is Dr. Ezzard Standing.  She denies any complaints secondary to Arimidex. She is tolerating it well. She denies any hot flashes, arthralgias, and myalgias.  We spent some time discussing her treatment plan which consists of 6-8 months of Arimidex therapy neoadjuvant lady to decrease tumor burden. At that point time, we'll get the patient to see Dr. Ezzard Standing for surgical consultation and discussion of mastectomy versus partial mastectomy and whether she is a candidate for either.  The patient asks me what will happen if she doesn't surgical candidate and I think that is a discussion for another day. We'll first see how she responds to Arimidex and then see what her surgeon reports regarding treatment options. We certainly have options that she's not a surgical candidate. And I informed her of this information  I personally reviewed and went over laboratory results with the patient.  Her renal function is sufficient for reclassified milligrams and this was verified with Pharmacist. This will need to be repeated in one year.  Oncologically, the patient denies any complaints of ROS questioning is negative.  The patient is unsure whether her breast mass has started to  shrink.  Past Medical History  Diagnosis Date  . Diabetes mellitus   . Pneumonia     history  . Kidney stones     histroy  . Hx: UTI (urinary tract infection)   . CVA (cerebral infarction) 09/2009  . Breast cancer 05/05/12    left, ER+/PR-, Her 2 -  . Leukemia     CLL, Dr Mariel Sleet, completed tx 2010  . COPD (chronic obstructive pulmonary disease)   . Hypothyroidism   . Stroke     x 3, last Nov 2011, right side weakness  . Neuropathy     diabetic- right side  . Invasive ductal carcinoma of left breast 05/13/2012    has CLL; POLYP, COLON; Diabetes insipidus; HYPERLIPIDEMIA; CVA; TIA; COPD; DUODENAL ULCER; GASTRITIS, ACUTE; RENAL CALCULUS; DYSPNEA; S/P bilateral hip replacements; Back pain; Invasive ductal carcinoma of left breast; CAP (community acquired pneumonia); and COPD with exacerbation on her problem list.     is allergic to cephalexin; levaquin; cranberry juice powder; avelox; ciprofloxacin; codeine; ibuprofen; lipitor; macrodantin; morphine sulfate; nitrofurantoin monohyd macro; paroxetine hcl; penicillins; salicylic acid; sulfa antibiotics; and vioxx.  [name] does not currently have medications on file.  Past Surgical History  Procedure Laterality Date  . Cardiac catheterization    . Lithotripsy    . Breast lumpectomy      left  breast  . Replacement total knee      right  . Appendectomy    . Ovarian cyst removal    . Total hip arthroplasty      left  . Bartholin gland cyst excision    . Total  hip arthroplasty      right  . Cholecystectomy    . Abdominal hysterectomy      TAH-BSO, endometriosis  . Core biopsy left breast  04/2012    Denies any headaches, dizziness, double vision, fevers, chills, night sweats, nausea, vomiting, diarrhea, constipation, chest pain, heart palpitations, shortness of breath, blood in stool, black tarry stool, urinary pain, urinary burning, urinary frequency, hematuria.   PHYSICAL EXAMINATION  ECOG PERFORMANCE STATUS: 3 -  Symptomatic, >50% confined to bed  There were no vitals filed for this visit.  GENERAL:alert, no distress, well nourished, well developed, comfortable, cooperative, smiling and pleasant lady in wheelchair SKIN: skin color, texture, turgor are normal, no rashes or significant lesions HEAD: Normocephalic, No masses, lesions, tenderness or abnormalities EYES: normal, PERRLA, EOMI, Conjunctiva are pink and non-injected EARS: External ears normal OROPHARYNX:mucous membranes are moist  NECK: supple, no adenopathy, thyroid normal size, non-tender, without nodularity, no stridor, non-tender, trachea midline LYMPH:  no palpable lymphadenopathy, no hepatosplenomegaly BREAST:right breast normal without mass, skin or nipple changes or axillary nodes, abnormal mass palpable in left breast in the 10 o'clock position measure 3 cm x 4 cm (craniocaudal x laterally), mobile, tight. LUNGS: clear to auscultation and percussion HEART: regular rate & rhythm, no murmurs, no gallops, S1 normal and S2 normal ABDOMEN:abdomen soft, non-tender, obese, normal bowel sounds, no masses or organomegaly and no hepatosplenomegaly BACK: Back symmetric, no curvature. EXTREMITIES:less then 2 second capillary refill, no joint deformities, effusion, or inflammation, no edema, no skin discoloration, no clubbing, no cyanosis  NEURO: alert & oriented x 3 with fluent speech, no focal motor/sensory deficits, in wheelchair    LABORATORY DATA: CBC    Component Value Date/Time   WBC 10.6* 07/20/2012 1447   RBC 3.79* 07/20/2012 1447   HGB 12.6 07/20/2012 1447   HCT 37.2 07/20/2012 1447   PLT 144* 07/20/2012 1447   MCV 98.2 07/20/2012 1447   MCH 33.2 07/20/2012 1447   MCHC 33.9 07/20/2012 1447   RDW 13.9 07/20/2012 1447   LYMPHSABS 6.7* 07/20/2012 1447   MONOABS 0.4 07/20/2012 1447   EOSABS 0.2 07/20/2012 1447   BASOSABS 0.0 07/20/2012 1447      Chemistry      Component Value Date/Time   NA 139 07/20/2012 1447   K 3.8 07/20/2012 1447   CL 103  07/20/2012 1447   CO2 29 07/20/2012 1447   BUN 24* 07/20/2012 1447   CREATININE 1.25* 07/20/2012 1447      Component Value Date/Time   CALCIUM 9.3 07/20/2012 1447   ALKPHOS 54 07/20/2012 1447   AST 13 07/20/2012 1447   ALT 10 07/20/2012 1447   BILITOT 0.2* 07/20/2012 1447        PATHOLOGY:  05/05/2012  ADDITIONAL INFORMATION: PROGNOSTIC INDICATORS - ACIS Results: IMMUNOHISTOCHEMICAL AND MORPHOMETRIC ANALYSIS BY THE AUTOMATED CELLULAR IMAGING SYSTEM (ACIS) Estrogen Receptor: 100%, POSITIVE, STRONG STAINING INTENSITY Progesterone Receptor: 0%, NEGATIVE Proliferation Marker Ki67: 32% COMMENT: The negative hormone receptor study in this case has no internal positive control. REFERENCE RANGE ESTROGEN RECEPTOR NEGATIVE <1% POSITIVE =>1% PROGESTERONE RECEPTOR NEGATIVE <1% POSITIVE =>1% All controls stained appropriately Pecola Leisure MD Pathologist, Electronic Signature ( Signed 05/13/2012) CHROMOGENIC IN-SITU HYBRIDIZATION Results: HER-2/NEU BY CISH - NO AMPLIFICATION OF HER-2 DETECTED. RESULT 1 of 3 FINAL for Tat, Rosemarie B [phone]) ADDITIONAL INFORMATION:(continued) RATIO OF HER2: CEP 17 SIGNALS 1.43 AVERAGE HER2 COPY NUMBER PER CELL 1.50 REFERENCE RANGE NEGATIVE HER2/Chr17 Ratio <2.0 and Average HER2 copy number <4.0 EQUIVOCAL HER2/Chr17 Ratio <2.0 and  Average HER2 copy number 4.0 and <6.0 POSITIVE HER2/Chr17 Ratio >=2.0 and/or Average HER2 copy number >=6.0 Pecola Leisure MD Pathologist, Electronic Signature ( Signed 05/11/2012) FINAL DIAGNOSIS Diagnosis Breast, left, needle core biopsy, 10 o'clock, 8 cm/nipple - INVASIVE DUCTAL CARCINOMA. - SEE COMMENT. Microscopic Comment Although definitive grading of breast carcinoma is best done on excision, the features of the invasive tumor from the left 10 o'clock position 8 cm from the nipple are compatible with a grade II breast carcinoma. Breast prognostic markers will be performed and reported in an addendum. The findings are  called to the [facility] on 05/06/12. Dr. Dierdre Searles has seen this case in consultation with agreement. (RAH:caf 05/06/12) Carla Abts MD Pathologist, Electronic Signature (Case signed 05/06/2012)      ASSESSMENT:  1. Clinical stage IIA (T2, N0, M0) invasive ductal carcinoma  ER 100%, PR 0%, Ki-67 32%, Her2 not amplified who was started neoadjuvantly on Arimidex 1 mg on 06/17/2012 daily by Dr. Jake Samples for ~6 months to decrease tumor burden in hopes of making improving surgical resection outcome.  Her surgeon is Dr. Ezzard Standing.  Patient Active Problem List   Diagnosis Date Noted  . CAP (community acquired pneumonia) 06/17/2012  . COPD with exacerbation 06/17/2012  . Invasive ductal carcinoma of left breast 05/13/2012  . S/P bilateral hip replacements 07/02/2011  . Back pain 07/02/2011  . CLL 01/11/2007  . Diabetes insipidus 01/11/2007  . HYPERLIPIDEMIA 01/11/2007  . CVA 01/11/2007  . TIA 01/11/2007  . COPD 01/11/2007  . DUODENAL ULCER 01/11/2007  . RENAL CALCULUS 01/11/2007  . DYSPNEA 01/11/2007  . POLYP, COLON 06/22/2002  . GASTRITIS, ACUTE 06/22/2002     PLAN:  1. I personally reviewed and went over laboratory results with the patient. 2. I personally reviewed and went over radiographic studies with the patient. 3. Chart reviewed 4. Continue Arimidex daily 5. Labs today: CMET 6. Reclast 5 mg today, to be repeated next year. 7. Labs in 3 months: CBC diff, CMET 8. Return in 3 months for follow-up  THERAPY PLAN:  The plan for this patient is to decrease the tumor burden of her breast cancer by utilizing Arimidex for 6-8 months. She started this medication on 06/17/2012 according to the nursing home's records. We'll see her back in 3 months for followup and making sure she is tolerating the medication well. At that time, we'll have to start discussions with the surgeon and coordinate a surgical consultation with Dr. Ezzard Standing approximately 3 months after her next  followup visit.  We administered her request today and now will need to be repeated next year.   All questions were answered. The patient knows to call the clinic with any problems, questions or concerns. We can certainly see the patient much sooner if necessary.  Patient and plan discussed with Dr. Gerarda Fraction and he is in agreement with the aforementioned.  Yariah Selvey

## 2012-10-20 ENCOUNTER — Encounter (HOSPITAL_BASED_OUTPATIENT_CLINIC_OR_DEPARTMENT_OTHER): Payer: Medicare Other | Admitting: Oncology

## 2012-10-20 ENCOUNTER — Encounter (HOSPITAL_COMMUNITY): Payer: Medicare Other | Attending: Hematology and Oncology

## 2012-10-20 ENCOUNTER — Encounter (HOSPITAL_COMMUNITY): Payer: Self-pay | Admitting: Oncology

## 2012-10-20 ENCOUNTER — Ambulatory Visit (HOSPITAL_COMMUNITY): Payer: Medicare Other | Admitting: Oncology

## 2012-10-20 VITALS — BP 150/72 | HR 71 | Temp 97.1°F | Resp 20 | Wt 187.8 lb

## 2012-10-20 DIAGNOSIS — C50912 Malignant neoplasm of unspecified site of left female breast: Secondary | ICD-10-CM

## 2012-10-20 DIAGNOSIS — C50219 Malignant neoplasm of upper-inner quadrant of unspecified female breast: Secondary | ICD-10-CM

## 2012-10-20 DIAGNOSIS — M810 Age-related osteoporosis without current pathological fracture: Secondary | ICD-10-CM

## 2012-10-20 DIAGNOSIS — R21 Rash and other nonspecific skin eruption: Secondary | ICD-10-CM

## 2012-10-20 DIAGNOSIS — J449 Chronic obstructive pulmonary disease, unspecified: Secondary | ICD-10-CM

## 2012-10-20 DIAGNOSIS — C50919 Malignant neoplasm of unspecified site of unspecified female breast: Secondary | ICD-10-CM | POA: Insufficient documentation

## 2012-10-20 DIAGNOSIS — M25519 Pain in unspecified shoulder: Secondary | ICD-10-CM

## 2012-10-20 HISTORY — DX: Age-related osteoporosis without current pathological fracture: M81.0

## 2012-10-20 LAB — COMPREHENSIVE METABOLIC PANEL
ALT: 8 U/L (ref 0–35)
Albumin: 3.8 g/dL (ref 3.5–5.2)
Calcium: 10.2 mg/dL (ref 8.4–10.5)
GFR calc Af Amer: 34 mL/min — ABNORMAL LOW (ref >90)
Glucose, Bld: 150 mg/dL — ABNORMAL HIGH (ref 70–99)
Sodium: 138 mEq/L (ref 135–145)
Total Protein: 6.7 g/dL (ref 6.0–8.3)

## 2012-10-20 LAB — CBC WITH DIFFERENTIAL/PLATELET
Basophils Absolute: 0 10*3/uL (ref 0.0–0.1)
Basophils Relative: 0 % (ref 0–1)
Eosinophils Absolute: 0.3 10*3/uL (ref 0.0–0.7)
Hemoglobin: 14.3 g/dL (ref 12.0–15.0)
Lymphocytes Relative: 65 % — ABNORMAL HIGH (ref 12–46)
MCH: 32.9 pg (ref 26.0–34.0)
MCHC: 34.2 g/dL (ref 30.0–36.0)
Monocytes Absolute: 0.7 10*3/uL (ref 0.1–1.0)
Neutrophils Relative %: 29 % — ABNORMAL LOW (ref 43–77)
Platelets: 138 10*3/uL — ABNORMAL LOW (ref 150–400)
RDW: 13.1 % (ref 11.5–15.5)

## 2012-10-20 MED ORDER — LORATADINE 10 MG PO TABS: 10.0000 mg | ORAL_TABLET | Freq: Every day | ORAL | Status: AC

## 2012-10-20 NOTE — Progress Notes (Addendum)
Carla Ribas, MD [address] [place] [address]  Invasive ductal carcinoma of left breast - Plan: CBC with Differential, Comprehensive metabolic panel  CURRENT THERAPY:Neoadjuvant Arimidex 1 mg daily beginning on 06/17/2012   INTERVAL HISTORY: [name] 79 y.o. female returns for  regular  visit for followup of Clinical stage IIA (T2, N0, M0) invasive ductal carcinoma ER 100%, PR 0%, Ki-67 32%, Her2 not amplified who was started neoadjuvantly on Arimidex 1 mg on 06/17/2012 daily by Dr. Jake Samples for ~6 months to decrease tumor burden in hopes of making improving surgical resection outcome. Her surgeon is Dr. Ezzard Standing.   She denies any complaints secondary to Arimidex. She is tolerating it well. She denies any hot flashes, arthralgias, and myalgias.  We spent some time discussing her treatment plan which consists of 6-8 months (total) of Arimidex therapy neoadjuvantly which she started on 06/17/2012 to decrease tumor burden. At that point time, we'll get the patient to see Dr. Ezzard Standing for surgical consultation and discussion of mastectomy versus partial mastectomy and whether she is a candidate for either.  Today, the patient has 2 large complaints.   1. Left shoulder pain that is exacerbated by left arm movement.  She reports that certain positions exacerbate the pain.  It is resolved with pain medication such as tylenol or Hydrocodone.  I initially recommended NSAID therapy, but it is noted that she has multiple allergies to NSAIDs, and therefore this class of medications will be avoided.  Therefore, it is recommended that she continue with Tylenol or Hydrocodone.  I recommend Ice/Heat as well.  If this pain continues, I would entertain plain xray of left shoulder to evaluate for metastatic disease although I am highly suspicious that this is musculoskeletal in nature.  2. Rash on abdomen and back beginning in mid-August.  She denies any new laundry  detergent, shampoo, soap, etc.  She also denies any new medications.  She reports that the rash is pruritic.  It is noted that she has a number of excoriations.  On the abdomen, there are a few areas that are psoariatic-like in appearance but this is an odd place as it is not on an extensor surface area.  We recommend that the patient hold her Arimidex x 7-10 days.  If the rash resolves, then we can start her on Aromasin as this may be an Arimidex-induced rash, although rare.  If the rash does not resolve, will then restart Arimidex and refer to Dermatology for evaluation.  Symptomatically, I will prescribe Loratadine for pruritis.  Family will call us in 7-10 days with an update on rash condition.  Physical exam demonstrates a continued response to Arimidex therapy with breast mass measuring 2 cm clinically.  We will move forward with another 2 months of therapy and then refer the patient to Dr. Ezzard Standing for consideration of surgery.  Oncologically, patient denies any complaints and ROS questioning is negative.    Past Medical History  Diagnosis Date  . Diabetes mellitus   . Pneumonia     history  . Kidney stones     histroy  . Hx: UTI (urinary tract infection)   . CVA (cerebral infarction) 09/2009  . Breast cancer 05/05/12    left, ER+/PR-, Her 2 -  . Leukemia     CLL, Dr Mariel Sleet, completed tx 2010  . COPD (chronic obstructive pulmonary disease)   . Hypothyroidism   . Stroke     x  3, last Nov 2011, right side weakness  . Neuropathy     diabetic- right side  . Invasive ductal carcinoma of left breast 05/13/2012    has CLL; POLYP, COLON; Diabetes insipidus; HYPERLIPIDEMIA; CVA; TIA; COPD; DUODENAL ULCER; GASTRITIS, ACUTE; RENAL CALCULUS; DYSPNEA; S/P bilateral hip replacements; Back pain; Invasive ductal carcinoma of left breast; CAP (community acquired pneumonia); and COPD with exacerbation on her problem list.     is allergic to cephalexin; levaquin; cranberry juice powder; avelox;  ciprofloxacin; codeine; ibuprofen; lipitor; morphine sulfate; nitrofurantoin monohyd macro; paroxetine hcl; penicillins; salicylic acid; sulfa antibiotics; and vioxx.  [name] had no medications administered during this visit.  Past Surgical History  Procedure Laterality Date  . Cardiac catheterization    . Lithotripsy    . Breast lumpectomy      left  breast  . Replacement total knee      right  . Appendectomy    . Ovarian cyst removal    . Total hip arthroplasty      left  . Bartholin gland cyst excision    . Total hip arthroplasty      right  . Cholecystectomy    . Abdominal hysterectomy      TAH-BSO, endometriosis  . Core biopsy left breast  04/2012    Denies any headaches, dizziness, double vision, fevers, chills, night sweats, nausea, vomiting, diarrhea, constipation, chest pain, heart palpitations, shortness of breath, blood in stool, black tarry stool, urinary pain, urinary burning, urinary frequency, hematuria.   PHYSICAL EXAMINATION  ECOG PERFORMANCE STATUS: 2 - Symptomatic, <50% confined to bed  Filed Vitals:   10/20/12 1435  BP: 150/72  Pulse: 71  Temp: 97.1 F (36.2 C)  Resp: 20    GENERAL:alert, no distress, well nourished, well developed, comfortable, cooperative, obese and smiling SKIN: Diffuse abdominal and back rash with some psoriatic-like plaque in agminate configuration some areas, but with other papular areas with excoriations HEAD: Normocephalic, No masses, lesions, tenderness or abnormalities EYES: normal, PERRLA, EOMI, Conjunctiva are pink and non-injected EARS: External ears normal OROPHARYNX:mucous membranes are moist  NECK: supple, trachea midline LYMPH:  no palpable lymphadenopathy BREAST:abnormal mass palpable in left breast at 10 o'clock position measuring 1.5 cm x 2 cm (craniocaudal x laterally), mobile and softer LUNGS: not examined HEART: not examined ABDOMEN:see skin exam BACK: see skin exam EXTREMITIES:positive findings:  Left  shoulder pain with abduction, internal and external rotation localized to acromion tip with tenderness to deep palpation of anterior [place] of acromion tip area. NEURO: alert & oriented x 3 with fluent speech, no focal motor/sensory deficits    LABORATORY DATA: CBC    Component Value Date/Time   WBC 17.0* 10/20/2012 1510   RBC 4.35 10/20/2012 1510   RBC 4.10 09/03/2009 1530   HGB 14.3 10/20/2012 1510   HCT 41.8 10/20/2012 1510   PLT 138* 10/20/2012 1510   MCV 96.1 10/20/2012 1510   MCH 32.9 10/20/2012 1510   MCHC 34.2 10/20/2012 1510   RDW 13.1 10/20/2012 1510   LYMPHSABS 11.1* 10/20/2012 1510   MONOABS 0.7 10/20/2012 1510   EOSABS 0.3 10/20/2012 1510   BASOSABS 0.0 10/20/2012 1510      Chemistry      Component Value Date/Time   NA 138 10/20/2012 1510   K 4.1 10/20/2012 1510   CL 101 10/20/2012 1510   CO2 27 10/20/2012 1510   BUN 31* 10/20/2012 1510   CREATININE 1.56* 10/20/2012 1510      Component Value Date/Time   CALCIUM  10.2 10/20/2012 1510   ALKPHOS 49 10/20/2012 1510   AST 14 10/20/2012 1510   ALT 8 10/20/2012 1510   BILITOT 0.2* 10/20/2012 1510        PATHOLOGY:  05/05/2012   ADDITIONAL INFORMATION:  PROGNOSTIC INDICATORS - ACIS  Results:  IMMUNOHISTOCHEMICAL AND MORPHOMETRIC ANALYSIS BY THE AUTOMATED CELLULAR  IMAGING SYSTEM (ACIS)  Estrogen Receptor: 100%, POSITIVE, STRONG STAINING INTENSITY  Progesterone Receptor: 0%, NEGATIVE  Proliferation Marker Ki67: 32%  COMMENT: The negative hormone receptor study in this case has no internal positive control.  REFERENCE RANGE  ESTROGEN RECEPTOR  NEGATIVE <1%  POSITIVE =>1%  PROGESTERONE RECEPTOR  NEGATIVE <1%  POSITIVE =>1%  All controls stained appropriately  Carla Leisure MD  Pathologist, Electronic Signature  ( Signed 05/13/2012)  CHROMOGENIC IN-SITU HYBRIDIZATION  Results:  HER-2/NEU BY CISH - NO AMPLIFICATION OF HER-2 DETECTED.  RESULT  1 of 3  FINAL for [name], [name])  ADDITIONAL  INFORMATION:(continued)  RATIO OF HER2: CEP 17 SIGNALS 1.43  AVERAGE HER2 COPY NUMBER PER CELL 1.50  REFERENCE RANGE  NEGATIVE HER2/Chr17 Ratio <2.0 and Average HER2 copy number <4.0  EQUIVOCAL HER2/Chr17 Ratio <2.0 and Average HER2 copy number 4.0 and <6.0  POSITIVE HER2/Chr17 Ratio >=2.0 and/or Average HER2 copy number >=6.0  Carla Leisure MD  Pathologist, Electronic Signature  ( Signed 05/11/2012)  FINAL DIAGNOSIS  Diagnosis  Breast, left, needle core biopsy, 10 o'clock, 8 cm/nipple  - INVASIVE DUCTAL CARCINOMA.  - SEE COMMENT.  Microscopic Comment  Although definitive grading of breast carcinoma is best done on excision, the features of the invasive tumor  from the left 10 o'clock position 8 cm from the nipple are compatible with a grade II breast carcinoma. Breast  prognostic markers will be performed and reported in an addendum. The findings are called to [facility] on 05/06/12. Dr. Dierdre Searles has seen this case in consultation with agreement. (RAH:caf  05/06/12)  Carla Abts MD  Pathologist, Electronic Signature  (Case signed 05/06/2012)    ASSESSMENT:  1. Clinical stage IIA (T2, N0, M0) invasive ductal carcinoma ER 100%, PR 0%, Ki-67 32%, Her2 not amplified who was started neoadjuvantly on Arimidex 1 mg on 06/17/2012 daily by Dr. Jake Samples for ~6 months to decrease tumor burden in hopes of making improving surgical resection outcome. Her surgeon is Dr. Ezzard Standing. 2. Osteoporosis, on Reclast yearly, last given on 07/20/2012.  Due again in July 2015. 3. Psoriatic-like rash on abdomen and back, with satellite macules and papules 4. Left shoulder pain   Patient Active Problem List   Diagnosis Date Noted  . CAP (community acquired pneumonia) 06/17/2012  . COPD with exacerbation 06/17/2012  . Invasive ductal carcinoma of left breast 05/13/2012  . S/P bilateral hip replacements 07/02/2011  . Back pain 07/02/2011  . CLL 01/11/2007  . Diabetes insipidus 01/11/2007    . HYPERLIPIDEMIA 01/11/2007  . CVA 01/11/2007  . TIA 01/11/2007  . COPD 01/11/2007  . DUODENAL ULCER 01/11/2007  . RENAL CALCULUS 01/11/2007  . DYSPNEA 01/11/2007  . POLYP, COLON 06/22/2002  . GASTRITIS, ACUTE 06/22/2002      PLAN:  1. I personally reviewed and went over laboratory results with the patient. 2. Chart reviewed 3. Hold Arimidex 7-10 days for rash.  If it resolves, will switch to Aromasin.  If it does not resolve, rash is not from medication and will restart Arimidex. Family will call clinic in 7-10 days for rash update. 4. Loratadine daily, Rx provided for  pruritis secondary to rash. 5. Tylenol or Hydrocodone for shoulder pain, patient allergic to multiple NSAIDs according to [facility]. 6. Reclast due in July 2015.  Supportive therapy plan built 7. Refer patient to Dr. Ezzard Standing in [facility] after next follow-up appointment 8. Labs today: CBC diff, CMET 9. Return in 2 months for follow-up at which time we will refer to Dr. Ezzard Standing for consideration of surgery.   THERAPY PLAN:  Due to rash, will hold Arimidex x 7-10 days.  If rash resolves, will switch to Aromasin.  If rash does not resolve, will restart Arimidex and consider dermatology referral.  Patient will return in 2 months for follow at which time we will refer to Dr. Ezzard Standing for consideration of surgery, if patient is a surgical candidate at all.  All questions were answered. The patient knows to call the clinic with any problems, questions or concerns. We can certainly see the patient much sooner if necessary.  Patient and plan discussed with Dr. Alla German and he is in agreement with the aforementioned.   More than 50% of the time spent with the patient was utilized for counseling and coordination of care.  [name]    Addendum:  Telephone call from Patient's caregiver.  Rash is much improved since holding Arimidex.  Order provided to D/C Arimidex.  Will start Aromasin 25 mg daily on 11/01/2012.  Rx  printed and faxed to facility at [phone].  [name]

## 2012-10-20 NOTE — Patient Instructions (Addendum)
[facility] Discharge Instructions  RECOMMENDATIONS MADE BY THE CONSULTANT AND ANY TEST RESULTS WILL BE SENT TO YOUR REFERRING PHYSICIAN.  HOLD Anastrozole (Arimidex) for now. Use Loratadine (antihistamine) for itching. Call us in 7-10 days to let us know if rash improves. [phone] Use tylenol or hydrocodone for your shoulder pain. Return to clinic in 2 months to see MD. Report any issues/concerns to clinic as needed.  Thank you for choosing [name] to provide your oncology and hematology care.  To afford each patient quality time with our providers, please arrive at least 15 minutes before your scheduled appointment time.  With your help, our goal is to use those 15 minutes to complete the necessary work-up to ensure our physicians have the information they need to help with your evaluation and healthcare recommendations.    Effective January 1st, 2014, we ask that you re-schedule your appointment with our physicians should you arrive 10 or more minutes late for your appointment.  We strive to give you quality time with our providers, and arriving late affects you and other patients whose appointments are after yours.    Again, thank you for choosing [facility].  Our hope is that these requests will decrease the amount of time that you wait before being seen by our physicians.       _____________________________________________________________  Should you have questions after your visit to [facility], please contact our office at [phone] between the hours of 8:30 a.m. and 5:00 p.m.  Voicemails left after 4:30 p.m. will not be returned until the following business day.  For prescription refill requests, have your pharmacy contact our office with your prescription refill request.

## 2012-10-28 ENCOUNTER — Other Ambulatory Visit (HOSPITAL_COMMUNITY): Payer: Self-pay | Admitting: Oncology

## 2012-10-28 DIAGNOSIS — C50912 Malignant neoplasm of unspecified site of left female breast: Secondary | ICD-10-CM

## 2012-10-28 MED ORDER — EXEMESTANE 25 MG PO TABS: 25.0000 mg | ORAL_TABLET | Freq: Every day | ORAL | Status: AC

## 2012-11-01 ENCOUNTER — Telehealth (HOSPITAL_COMMUNITY): Payer: Self-pay | Admitting: Oncology

## 2012-11-01 NOTE — Telephone Encounter (Signed)
[name]'s caregiver at the nursing home, Toniann Fail, called to state the patient has scabies - states she has seen it in another resident in the past couple of months.  Samuella Bruin, PA-C advised of same, and patient is to continue with dermatology appointment which is scheduled for tomorrow - Jackqulyn Livings states she has spoken with the care giver regarding the appointment.

## 2012-11-01 NOTE — Telephone Encounter (Signed)
I received a telephone call from caregiver from nursing home regarding patient's rash.  She reported that the rash was much improved since holding Arimidex x 7 days.  Therefore, I made the decision to start her on Aromasin daily beginning on 11/01/2012 (today).  However, the facility reports that they started the patient on the new medication on Thursday 10/28/2012.  Today, the patient's daughter reports that the rash never cleared up and today, the rash is more prominent and has spread.  I do not suspect a drug rash is the cause, but I have asked the facility to hold the Aromasin, and refer the patient to dermatology.   [place]

## 2012-12-20 ENCOUNTER — Encounter (HOSPITAL_COMMUNITY): Payer: Medicare Other | Attending: Hematology and Oncology

## 2012-12-20 ENCOUNTER — Encounter (HOSPITAL_COMMUNITY): Payer: Self-pay

## 2012-12-20 VITALS — BP 165/69 | HR 66 | Temp 97.0°F | Resp 18

## 2012-12-20 DIAGNOSIS — C50912 Malignant neoplasm of unspecified site of left female breast: Secondary | ICD-10-CM

## 2012-12-20 DIAGNOSIS — C50919 Malignant neoplasm of unspecified site of unspecified female breast: Secondary | ICD-10-CM | POA: Insufficient documentation

## 2012-12-20 DIAGNOSIS — C9110 Chronic lymphocytic leukemia of B-cell type not having achieved remission: Secondary | ICD-10-CM | POA: Insufficient documentation

## 2012-12-20 DIAGNOSIS — C50219 Malignant neoplasm of upper-inner quadrant of unspecified female breast: Secondary | ICD-10-CM

## 2012-12-20 DIAGNOSIS — M810 Age-related osteoporosis without current pathological fracture: Secondary | ICD-10-CM

## 2012-12-20 DIAGNOSIS — Z170 Estrogen receptor positive status [ER+]: Secondary | ICD-10-CM

## 2012-12-20 DIAGNOSIS — R21 Rash and other nonspecific skin eruption: Secondary | ICD-10-CM

## 2012-12-20 LAB — CBC WITH DIFFERENTIAL/PLATELET
Basophils Relative: 0 % (ref 0–1)
HCT: 39.8 % (ref 36.0–46.0)
Hemoglobin: 13.6 g/dL (ref 12.0–15.0)
Lymphocytes Relative: 56 % — ABNORMAL HIGH (ref 12–46)
Lymphs Abs: 7.6 10*3/uL — ABNORMAL HIGH (ref 0.7–4.0)
MCHC: 34.2 g/dL (ref 30.0–36.0)
MCV: 95.2 fL (ref 78.0–100.0)
Monocytes Relative: 4 % (ref 3–12)
Neutro Abs: 5 10*3/uL (ref 1.7–7.7)
WBC: 13.5 10*3/uL — ABNORMAL HIGH (ref 4.0–10.5)

## 2012-12-20 NOTE — Patient Instructions (Addendum)
[facility] Discharge Instructions  RECOMMENDATIONS MADE BY THE CONSULTANT AND ANY TEST RESULTS WILL BE SENT TO YOUR REFERRING PHYSICIAN.  EXAM FINDINGS BY THE PHYSICIAN TODAY AND SIGNS OR SYMPTOMS TO REPORT TO CLINIC OR PRIMARY PHYSICIAN: Dr. Zigmund Daniel Ultrasound of left breast at 1045 on 12/29/2012  Return in 3 months to see MD & labs  Labs drawn today: CBC diff     Thank you for choosing [name] to provide your oncology and hematology care.  To afford each patient quality time with our providers, please arrive at least 15 minutes before your scheduled appointment time.  With your help, our goal is to use those 15 minutes to complete the necessary work-up to ensure our physicians have the information they need to help with your evaluation and healthcare recommendations.    Effective January 1st, 2014, we ask that you re-schedule your appointment with our physicians should you arrive 10 or more minutes late for your appointment.  We strive to give you quality time with our providers, and arriving late affects you and other patients whose appointments are after yours.    Again, thank you for choosing [facility].  Our hope is that these requests will decrease the amount of time that you wait before being seen by our physicians.       _____________________________________________________________  Should you have questions after your visit to [facility], please contact our office at [phone] between the hours of 8:30 a.m. and 5:00 p.m.  Voicemails left after 4:30 p.m. will not be returned until the following business day.  For prescription refill requests, have your pharmacy contact our office with your prescription refill request.

## 2012-12-20 NOTE — Progress Notes (Signed)
[facility]  OFFICE PROGRESS NOTE  Colette Ribas, MD [address] [place] [address]  DIAGNOSIS: Invasive ductal carcinoma of left breast - Plan: CEA, Cancer antigen 27.29, CBC with Differential, Comprehensive metabolic panel, CEA, Cancer antigen 27.29, CBC with Differential, US Breast Left, CBC with Differential, Comprehensive metabolic panel, CEA, Cancer antigen 27.29, CBC with Differential  Osteoporosis, unspecified - Plan: Vitamin D 25 hydroxy  Rash  Chronic lymphocytic leukemia (CLL), B-cell - Plan: CBC with Differential, Comprehensive metabolic panel, CEA, Cancer antigen 27.29, Beta 2 microglobuline, serum, Lactate dehydrogenase  Chief Complaint  Patient presents with  . Breast Cancer    CURRENT THERAPY: Exemestane 25 mg daily.  INTERVAL HISTORY: [name] 79 y.o. female returns for followup of clinical stage II a left breast cancer being treated with neoadjuvant aromatase inhibitor treatment. She was on anastrozole but developed a rash. She stopped the drug and concurrently was treated for scabies with improvement in the rash. She is currently taking exemestane 25 mg daily with no rash. Appetite is good with no nausea or vomiting but she does feel fatigued during the day. She does nap during the day and has intermittent interruptions in sleep during the night. She denies any vaginal dryness, hot flashes, cough, wheezing, swelling of the left upper extremity, lower extremity swelling or redness.  MEDICAL HISTORY: Past Medical History  Diagnosis Date  . Diabetes mellitus   . Pneumonia     history  . Kidney stones     histroy  . Hx: UTI (urinary tract infection)   . CVA (cerebral infarction) 09/2009  . Breast cancer 05/05/12    left, ER+/PR-, Her 2 -  . Leukemia     CLL, Dr Mariel Sleet, completed tx 2010  . COPD (chronic obstructive pulmonary disease)   . Hypothyroidism   . Stroke     x 3,  last Nov 2011, right side weakness  . Neuropathy     diabetic- right side  . Invasive ductal carcinoma of left breast 05/13/2012  . Osteoporosis, unspecified 10/20/2012    INTERIM HISTORY: has CLL; POLYP, COLON; Diabetes insipidus; HYPERLIPIDEMIA; CVA; TIA; COPD; DUODENAL ULCER; GASTRITIS, ACUTE; RENAL CALCULUS; DYSPNEA; S/P bilateral hip replacements; Back pain; Invasive ductal carcinoma of left breast; CAP (community acquired pneumonia); COPD with exacerbation; and Osteoporosis, unspecified on her problem list.    ALLERGIES:  is allergic to arimidex; cephalexin; levaquin; cranberry juice powder; avelox; ciprofloxacin; codeine; ibuprofen; lipitor; morphine sulfate; nitrofurantoin monohyd macro; paroxetine hcl; penicillins; salicylic acid; sulfa antibiotics; and vioxx.  MEDICATIONS: has a current medication list which includes the following prescription(s): acetaminophen, albuterol, bifidobacterium infantis, calcium carbonate-vitamin d, clobetasol cream, exemestane, fluticasone, hydrocodone-acetaminophen, loratadine, sitagliptin, and triamcinolone cream.  SURGICAL HISTORY:  Past Surgical History  Procedure Laterality Date  . Cardiac catheterization    . Lithotripsy    . Breast lumpectomy      left  breast  . Replacement total knee      right  . Appendectomy    . Ovarian cyst removal    . Total hip arthroplasty      left  . Bartholin gland cyst excision    . Total hip arthroplasty      right  . Cholecystectomy    . Abdominal hysterectomy      TAH-BSO, endometriosis  . Core biopsy left breast  04/2012  . Kidney stone surgery Right 1979    In [place], [place]  FAMILY HISTORY: family history includes Cancer in her brother and father; Diabetes in an other family member; Kidney disease in her brother and another family member.  SOCIAL HISTORY:  reports that she quit smoking about 20 years ago. She has never used smokeless tobacco. She reports that she drinks alcohol. She reports that she  does not use illicit drugs.  REVIEW OF SYSTEMS:  Other than that discussed above is noncontributory.  PHYSICAL EXAMINATION: ECOG PERFORMANCE STATUS: 2 - Symptomatic, <50% confined to bed  Blood pressure 165/69, pulse 66, temperature 97 F (36.1 C), temperature source Oral, resp. rate 18.  GENERAL:alert, no distress and comfortable SKIN: skin color, texture, turgor are normal, no rashes or significant lesions EYES: PERLA; Conjunctiva are pink and non-injected, sclera clear OROPHARYNX:no exudate, no erythema on lips, buccal mucosa, or tongue. NECK: supple, thyroid normal size, non-tender, without nodularity. No masses CHEST: Left breast mass measuring about 2 cm in size. LYMPH:  no palpable lymphadenopathy in the cervical, axillary or inguinal LUNGS: clear to auscultation and percussion with normal breathing effort HEART: regular rate & rhythm and no murmurs. ABDOMEN:abdomen soft, non-tender and normal bowel sounds MUSCULOSKELETAL:no cyanosis of digits and no clubbing. Range of motion normal.  NEURO: alert & oriented x 3 with fluent speech, no focal motor/sensory deficits   LABORATORY DATA: No visits with results within 30 Day(s) from this visit. Latest known visit with results is:  Office Visit on 10/20/2012  Component Date Value Range Status  . WBC 10/20/2012 17.0* 4.0 - 10.5 K/uL Final  . RBC 10/20/2012 4.35  3.87 - 5.11 MIL/uL Final  . Hemoglobin 10/20/2012 14.3  12.0 - 15.0 g/dL Final  . HCT 16/10/9602 41.8  36.0 - 46.0 % Final  . MCV 10/20/2012 96.1  78.0 - 100.0 fL Final  . MCH 10/20/2012 32.9  26.0 - 34.0 pg Final  . MCHC 10/20/2012 34.2  30.0 - 36.0 g/dL Final  . RDW 54/09/8117 13.1  11.5 - 15.5 % Final  . Platelets 10/20/2012 138* 150 - 400 K/uL Final  . Neutrophils Relative % 10/20/2012 29* 43 - 77 % Final  . Lymphocytes Relative 10/20/2012 65* 12 - 46 % Final  . Monocytes Relative 10/20/2012 4  3 - 12 % Final  . Eosinophils Relative 10/20/2012 2  0 - 5 % Final  .  Basophils Relative 10/20/2012 0  0 - 1 % Final  . Neutro Abs 10/20/2012 4.9  1.7 - 7.7 K/uL Final  . Lymphs Abs 10/20/2012 11.1* 0.7 - 4.0 K/uL Final  . Monocytes Absolute 10/20/2012 0.7  0.1 - 1.0 K/uL Final  . Eosinophils Absolute 10/20/2012 0.3  0.0 - 0.7 K/uL Final  . Basophils Absolute 10/20/2012 0.0  0.0 - 0.1 K/uL Final  . WBC Morphology 10/20/2012 WHITE COUNT CONFIRMED ON SMEAR   Final   Comment: ABSOLUTE LYMPHOCYTOSIS                          ATYPICAL LYMPHOCYTES                          SMUDGE CELLS  . Sodium 10/20/2012 138  135 - 145 mEq/L Final  . Potassium 10/20/2012 4.1  3.5 - 5.1 mEq/L Final  . Chloride 10/20/2012 101  96 - 112 mEq/L Final  . CO2 10/20/2012 27  19 - 32 mEq/L Final  . Glucose, Bld 10/20/2012 150* 70 - 99 mg/dL Final  . BUN 14/78/2956 31*  6 - 23 mg/dL Final  . Creatinine, Ser 10/20/2012 1.56* 0.50 - 1.10 mg/dL Final  . Calcium 16/10/9602 10.2  8.4 - 10.5 mg/dL Final  . Total Protein 10/20/2012 6.7  6.0 - 8.3 g/dL Final  . Albumin 54/09/8117 3.8  3.5 - 5.2 g/dL Final  . AST 14/78/2956 14  0 - 37 U/L Final  . ALT 10/20/2012 8  0 - 35 U/L Final  . Alkaline Phosphatase 10/20/2012 49  39 - 117 U/L Final  . Total Bilirubin 10/20/2012 0.2* 0.3 - 1.2 mg/dL Final  . GFR calc non Af Amer 10/20/2012 29* >90 mL/min Final  . GFR calc Af Amer 10/20/2012 34* >90 mL/min Final   Comment: (NOTE)                          The eGFR has been calculated using the CKD EPI equation.                          This calculation has not been validated in all clinical situations.                          eGFR's persistently <90 mL/min signify possible Chronic Kidney                          Disease.    PATHOLOGY: No new pathology.  Urinalysis    Component Value Date/Time   COLORURINE YELLOW 04/18/2010 0155   APPEARANCEUR CLOUDY* 04/18/2010 0155   LABSPEC 1.010 04/18/2010 0155   PHURINE 6.0 04/18/2010 0155   GLUCOSEU NEGATIVE 04/18/2010 0155   HGBUR NEGATIVE 04/18/2010 0155    BILIRUBINUR NEGATIVE 04/18/2010 0155   KETONESUR NEGATIVE 04/18/2010 0155   PROTEINUR NEGATIVE 04/18/2010 0155   UROBILINOGEN 0.2 04/18/2010 0155   NITRITE NEGATIVE 04/18/2010 0155   LEUKOCYTESUR NEGATIVE MICROSCOPIC NOT DONE ON URINES WITH NEGATIVE PROTEIN, BLOOD, LEUKOCYTES, NITRITE, OR GLUCOSE <1000 mg/dL. 04/18/2010 0155    RADIOGRAPHIC STUDIES: No results found.  ASSESSMENT: #20.79 y.o. female returns for regular visit for followup of Clinical stage IIA (T2, N0, M0) invasive ductal carcinoma ER 100%, PR 0%, Ki-67 32%, Her2 not amplified who was started neoadjuvantly on Arimidex 1 mg on 06/17/2012 daily by Dr. Jake Samples for ~6 months to decrease tumor burden in hopes of making improving surgical resection outcome. Her surgeon is Dr. Ezzard Standing better tolerance of exemestane with evidence of clinical response. #2. Osteoporosis, on yearly Reclast with last dose given 07/20/2012. #3. Skin rash, resolved after treatment for scabies and changing aromatase inhibitor(Exemestane). #4. Stage 0 chronic lymphocytic leukemia, stable.    PLAN:  #1. Continue exemestane 25 mg daily along with other medications. #2. Repeat ultrasound the left breast for quantification of response. #3. Followup in 3 months. Labs at that time including CBC, chem profile, CEA, CA 27-29, reticulocyte count, beta 2 microglobulin, and LDH.   All questions were answered. The patient knows to call the clinic with any problems, questions or concerns. We can certainly see the patient much sooner if necessary.   I spent 25 minutes counseling the patient face to face. The total time spent in the appointment was 30 minutes.    Maurilio Lovely, MD 12/20/2012 4:29 PM

## 2012-12-29 ENCOUNTER — Ambulatory Visit (HOSPITAL_COMMUNITY)
Admission: RE | Admit: 2012-12-29 | Discharge: 2012-12-29 | Disposition: A | Discharge status: Home / Self Care | Payer: Medicare Other | Source: Ambulatory Visit | Attending: Hematology and Oncology | Admitting: Hematology and Oncology

## 2012-12-29 DIAGNOSIS — C50912 Malignant neoplasm of unspecified site of left female breast: Secondary | ICD-10-CM

## 2012-12-29 DIAGNOSIS — C50219 Malignant neoplasm of upper-inner quadrant of unspecified female breast: Secondary | ICD-10-CM | POA: Insufficient documentation

## 2012-12-30 ENCOUNTER — Telehealth (HOSPITAL_COMMUNITY): Payer: Self-pay

## 2012-12-30 NOTE — Telephone Encounter (Signed)
Message left on daughter's voicemail.  To call back with any questions.

## 2012-12-30 NOTE — Telephone Encounter (Signed)
Message copied by Evelena Leyden on Thu Dec 30, 2012 10:17 AM ------      Message from: Alla German A      Created: Thu Dec 30, 2012  7:26 AM       Please notify Mrs. Sturgeon's daughter that her left breast mass continues to decrease in size on latest Ultrasound and she should continue on her oral aromatase inhibitor. Thanks. Dr.F ------

## 2013-03-21 ENCOUNTER — Encounter (HOSPITAL_COMMUNITY): Payer: Self-pay

## 2013-03-21 ENCOUNTER — Encounter (HOSPITAL_COMMUNITY): Payer: Medicare Other | Attending: Hematology and Oncology

## 2013-03-21 VITALS — BP 140/84 | HR 72 | Temp 97.5°F | Resp 18 | Wt 197.8 lb

## 2013-03-21 DIAGNOSIS — C50912 Malignant neoplasm of unspecified site of left female breast: Secondary | ICD-10-CM

## 2013-03-21 DIAGNOSIS — C9110 Chronic lymphocytic leukemia of B-cell type not having achieved remission: Secondary | ICD-10-CM

## 2013-03-21 DIAGNOSIS — C50919 Malignant neoplasm of unspecified site of unspecified female breast: Secondary | ICD-10-CM | POA: Diagnosis present

## 2013-03-21 DIAGNOSIS — M810 Age-related osteoporosis without current pathological fracture: Secondary | ICD-10-CM | POA: Diagnosis present

## 2013-03-21 LAB — COMPREHENSIVE METABOLIC PANEL
ALT: 10 U/L (ref 0–35)
AST: 20 U/L (ref 0–37)
Albumin: 3.8 g/dL (ref 3.5–5.2)
Alkaline Phosphatase: 73 U/L (ref 39–117)
BUN: 17 mg/dL (ref 6–23)
CALCIUM: 9.7 mg/dL (ref 8.4–10.5)
CO2: 27 mEq/L (ref 19–32)
CREATININE: 1.37 mg/dL — AB (ref 0.50–1.10)
Chloride: 102 mEq/L (ref 96–112)
GFR, EST AFRICAN AMERICAN: 40 mL/min — AB (ref >90)
GFR, EST NON AFRICAN AMERICAN: 34 mL/min — AB (ref >90)
Glucose, Bld: 204 mg/dL — ABNORMAL HIGH (ref 70–99)
Potassium: 4.2 mEq/L (ref 3.7–5.3)
SODIUM: 142 meq/L (ref 137–147)
TOTAL PROTEIN: 7 g/dL (ref 6.0–8.3)
Total Bilirubin: 0.4 mg/dL (ref 0.3–1.2)

## 2013-03-21 LAB — CBC WITH DIFFERENTIAL/PLATELET
BASOS ABS: 0 10*3/uL (ref 0.0–0.1)
Basophils Relative: 0 % (ref 0–1)
EOS ABS: 0.1 10*3/uL (ref 0.0–0.7)
Eosinophils Relative: 1 % (ref 0–5)
HCT: 40.3 % (ref 36.0–46.0)
Hemoglobin: 13.7 g/dL (ref 12.0–15.0)
LYMPHS ABS: 7.9 10*3/uL — AB (ref 0.7–4.0)
Lymphocytes Relative: 57 % — ABNORMAL HIGH (ref 12–46)
MCH: 31.9 pg (ref 26.0–34.0)
MCHC: 34 g/dL (ref 30.0–36.0)
MCV: 93.9 fL (ref 78.0–100.0)
MONO ABS: 0.5 10*3/uL (ref 0.1–1.0)
Monocytes Relative: 4 % (ref 3–12)
NEUTROS PCT: 38 % — AB (ref 43–77)
Neutro Abs: 5.2 10*3/uL (ref 1.7–7.7)
PLATELETS: 155 10*3/uL (ref 150–400)
RBC: 4.29 MIL/uL (ref 3.87–5.11)
RDW: 13.6 % (ref 11.5–15.5)
WBC: 13.7 10*3/uL — ABNORMAL HIGH (ref 4.0–10.5)

## 2013-03-21 LAB — LACTATE DEHYDROGENASE: LDH: 155 U/L (ref 94–250)

## 2013-03-21 NOTE — Patient Instructions (Signed)
[facility] Discharge Instructions  RECOMMENDATIONS MADE BY THE CONSULTANT AND ANY TEST RESULTS WILL BE SENT TO YOUR REFERRING PHYSICIAN.  We will see you in 3 months for follow up.  Prior to the visit you will need an Ultrasound of your left breast.  Thank you for choosing [facility] to provide your oncology and hematology care.  To afford each patient quality time with our providers, please arrive at least 15 minutes before your scheduled appointment time.  With your help, our goal is to use those 15 minutes to complete the necessary work-up to ensure our physicians have the information they need to help with your evaluation and healthcare recommendations.    Effective January 1st, 2014, we ask that you re-schedule your appointment with our physicians should you arrive 10 or more minutes late for your appointment.  We strive to give you quality time with our providers, and arriving late affects you and other patients whose appointments are after yours.    Again, thank you for choosing [facility].  Our hope is that these requests will decrease the amount of time that you wait before being seen by our physicians.       _____________________________________________________________  Should you have questions after your visit to [facility], please contact our office at [phone] between the hours of 8:30 a.m. and 5:00 p.m.  Voicemails left after 4:30 p.m. will not be returned until the following business day.  For prescription refill requests, have your pharmacy contact our office with your prescription refill request.

## 2013-03-21 NOTE — Progress Notes (Signed)
[place]  OFFICE PROGRESS NOTE  Purvis Kilts, MD [address] [place] [address]  DIAGNOSIS: Invasive ductal carcinoma of left breast - Plan: Cancer antigen 27.29, CBC with Differential, Comprehensive metabolic panel, CEA, US Breast Bilateral, [place] Unlisted Procedure Breast, CBC with Differential, Comprehensive metabolic panel, CEA, Cancer antigen 27.29, CANCELED: Cancer antigen 27.29  Chronic lymphocytic leukemia (CLL), B-cell - Plan: Beta 2 microglobuline, serum, Lactate dehydrogenase, Beta 2 microglobuline, serum, Lactate dehydrogenase, US Breast Bilateral, CBC with Differential, Reticulocytes, Lactate dehydrogenase, Comprehensive metabolic panel, Beta 2 microglobuline, serum  Osteoporosis, unspecified - Plan: Vitamin D 25 hydroxy, US Breast Bilateral  Chief Complaint  Patient presents with  . Breast Cancer    Neoadjuvant exemestane  . Chronic lymphocytic leukemia    CURRENT THERAPY: Neoadjuvant exemestane for left breast cancer diagnosed on 4/23/ 2014 treated with biopsy only surgically.  INTERVAL HISTORY: [name] 80 y.o. female returns for followup of left breast cancer, ER positive, status post biopsy in April of 2014 being treated neo-adjuvantly with exemestane with most recent ultrasound done on 12/29/2012 showing decrease in size. She also has history of chronic lymphocytic leukemia treated in the past in 2010 along with ALS currently living in [facility] assisted living. She is also suffered a stroke in the past with right-sided weakness since November of 2011 and peripheral neuropathy secondary to diabetes. She feels more fatigued he continues to walk with a walker at the assisted living facility with primarily right lower weaextremity kness. She was recently seen at [facility] because she had a chicken bone stuck in her throat about a week ago which was removed endoscopically. she  denies any breast pain, upper extremity swelling, but does have minimal right lower extremity swelling. She denies any hot flashes, vaginal dryness, or significant joint discomfort. Appetite is good with no nausea, vomiting, diarrhea, constipation, incontinence, skin rash, headache, or seizures.   MEDICAL HISTORY: Past Medical History  Diagnosis Date  . Diabetes mellitus   . Pneumonia     history  . Kidney stones     histroy  . Hx: UTI (urinary tract infection)   . CVA (cerebral infarction) 09/2009  . Breast cancer 05/05/12    left, ER+/PR-, Her 2 -  . Leukemia     CLL, Dr Tressie Stalker, completed tx 2010  . COPD (chronic obstructive pulmonary disease)   . Hypothyroidism   . Stroke     x 3, last Nov 2011, right side weakness  . Neuropathy     diabetic- right side  . Invasive ductal carcinoma of left breast 05/13/2012  . Osteoporosis, unspecified 10/20/2012    INTERIM HISTORY: has CLL; POLYP, COLON; Diabetes insipidus; HYPERLIPIDEMIA; CVA; TIA; COPD; DUODENAL ULCER; GASTRITIS, ACUTE; RENAL CALCULUS; DYSPNEA; S/P bilateral hip replacements; Back pain; Invasive ductal carcinoma of left breast; CAP (community acquired pneumonia); COPD with exacerbation; and Osteoporosis, unspecified on her problem list.    ALLERGIES:  is allergic to arimidex; cephalexin; levaquin; cranberry juice powder; avelox; ciprofloxacin; codeine; ibuprofen; lipitor; morphine sulfate; nitrofurantoin monohyd macro; paroxetine hcl; penicillins; salicylic acid; sulfa antibiotics; and vioxx.  MEDICATIONS: has a current medication list which includes the following prescription(s): acetaminophen, albuterol, bifidobacterium infantis, calcium carbonate-vitamin d, clobetasol cream, exemestane, fluticasone, hydrocodone-acetaminophen, loratadine, pantoprazole, sitagliptin, and triamcinolone cream.  SURGICAL HISTORY:  Past Surgical History  Procedure Laterality Date  . Cardiac catheterization    . Lithotripsy    . Breast lumpectomy  left  breast  . Replacement total knee      right  . Appendectomy    . Ovarian cyst removal    . Total hip arthroplasty      left  . Bartholin gland cyst excision    . Total hip arthroplasty      right  . Cholecystectomy    . Abdominal hysterectomy      TAH-BSO, endometriosis  . Core biopsy left breast  04/2012  . Kidney stone surgery Right 1979    In [place], [place]    FAMILY HISTORY: family history includes Cancer in her brother and father; Diabetes in an other family member; Kidney disease in her brother and another family member.  SOCIAL HISTORY:  reports that she quit smoking about 21 years ago. She has never used smokeless tobacco. She reports that she drinks alcohol. She reports that she does not use illicit drugs.  REVIEW OF SYSTEMS:  Other than that discussed above is noncontributory.  PHYSICAL EXAMINATION: ECOG PERFORMANCE STATUS: 2 - Symptomatic, <50% confined to bed  Blood pressure 140/84, pulse 72, temperature 97.5 F (36.4 C), temperature source Oral, resp. rate 18, weight 197 lb 12.8 oz (89.721 kg), SpO2 99.00%.  GENERAL:alert, no distress and comfortable SKIN: skin color, texture, turgor are normal, no rashes or significant lesions EYES: PERLA; Conjunctiva are pink and non-injected, sclera clear OROPHARYNX:no exudate, no erythema on lips, buccal mucosa, or tongue. NECK: supple, thyroid normal size, non-tender, without nodularity. No masses CHEST:  right breast without mass. Left breast masses are palpable at this time. LYMPH:  no palpable lymphadenopathy in the cervical, axillary or inguinal LUNGS: clear to auscultation and percussion with normal breathing effort HEART: regular rate & rhythm and no murmurs. ABDOMEN:abdomen soft, non-tender and normal bowel sounds MUSCULOSKELETAL:no cyanosis of digits and no clubbing. Range of motion normal.  NEURO: alert & oriented x 3 with fluent speech, no focal motor/sensory deficits   LABORATORY DATA: Office Visit on  03/21/2013  Component Date Value Ref Range Status  . Vit D, 25-Hydroxy 03/21/2013 36  30 - 89 ng/mL Final   Comment: (NOTE)                          This assay accurately quantifies Vitamin D, which is the sum of the                          25-Hydroxy forms of Vitamin D2 and D3.  Studies have shown that the                          optimum concentration of 25-Hydroxy Vitamin D is 30 ng/mL or higher.                           Concentrations of Vitamin D between 20 and 29 ng/mL are considered to                          be insufficient and concentrations less than 20 ng/mL are considered                          to be deficient for Vitamin D.  Performed at [facility]  . WBC 03/21/2013 13.7* 4.0 - 10.5 K/uL Final  . RBC 03/21/2013 4.29  3.87 - 5.11 MIL/uL Final  . Hemoglobin 03/21/2013 13.7  12.0 - 15.0 g/dL Final  . HCT 03/21/2013 40.3  36.0 - 46.0 % Final  . MCV 03/21/2013 93.9  78.0 - 100.0 fL Final  . MCH 03/21/2013 31.9  26.0 - 34.0 pg Final  . MCHC 03/21/2013 34.0  30.0 - 36.0 g/dL Final  . RDW 03/21/2013 13.6  11.5 - 15.5 % Final  . Platelets 03/21/2013 155  150 - 400 K/uL Final  . Neutrophils Relative % 03/21/2013 38* 43 - 77 % Final  . Lymphocytes Relative 03/21/2013 57* 12 - 46 % Final  . Monocytes Relative 03/21/2013 4  3 - 12 % Final  . Eosinophils Relative 03/21/2013 1  0 - 5 % Final  . Basophils Relative 03/21/2013 0  0 - 1 % Final  . Neutro Abs 03/21/2013 5.2  1.7 - 7.7 K/uL Final  . Lymphs Abs 03/21/2013 7.9* 0.7 - 4.0 K/uL Final  . Monocytes Absolute 03/21/2013 0.5  0.1 - 1.0 K/uL Final  . Eosinophils Absolute 03/21/2013 0.1  0.0 - 0.7 K/uL Final  . Basophils Absolute 03/21/2013 0.0  0.0 - 0.1 K/uL Final  . WBC Morphology 03/21/2013 ABSOLUTE LYMPHOCYTOSIS   Final   Comment: ATYPICAL LYMPHOCYTES                          SMUDGE CELLS  . Sodium 03/21/2013 142  137 - 147 mEq/L Final  . Potassium 03/21/2013 4.2  3.7 - 5.3 mEq/L Final  .  Chloride 03/21/2013 102  96 - 112 mEq/L Final  . CO2 03/21/2013 27  19 - 32 mEq/L Final  . Glucose, Bld 03/21/2013 204* 70 - 99 mg/dL Final  . BUN 03/21/2013 17  6 - 23 mg/dL Final  . Creatinine, Ser 03/21/2013 1.37* 0.50 - 1.10 mg/dL Final  . Calcium 03/21/2013 9.7  8.4 - 10.5 mg/dL Final  . Total Protein 03/21/2013 7.0  6.0 - 8.3 g/dL Final  . Albumin 03/21/2013 3.8  3.5 - 5.2 g/dL Final  . AST 03/21/2013 20  0 - 37 U/L Final  . ALT 03/21/2013 10  0 - 35 U/L Final  . Alkaline Phosphatase 03/21/2013 73  39 - 117 U/L Final  . Total Bilirubin 03/21/2013 0.4  0.3 - 1.2 mg/dL Final  . GFR calc non Af Amer 03/21/2013 34* >90 mL/min Final  . GFR calc Af Amer 03/21/2013 40* >90 mL/min Final   Comment: (NOTE)                          The eGFR has been calculated using the CKD EPI equation.                          This calculation has not been validated in all clinical situations.                          eGFR's persistently <90 mL/min signify possible Chronic Kidney                          Disease.  Marland Kitchen LDH 03/21/2013 155  94 - 250 U/L Final    PATHOLOGY: no new pathology.  Urinalysis  Component Value Date/Time   COLORURINE YELLOW 04/18/2010 0155   APPEARANCEUR CLOUDY* 04/18/2010 0155   LABSPEC 1.010 04/18/2010 0155   PHURINE 6.0 04/18/2010 0155   GLUCOSEU NEGATIVE 04/18/2010 0155   HGBUR NEGATIVE 04/18/2010 0155   BILIRUBINUR NEGATIVE 04/18/2010 0155   KETONESUR NEGATIVE 04/18/2010 0155   PROTEINUR NEGATIVE 04/18/2010 0155   UROBILINOGEN 0.2 04/18/2010 0155   NITRITE NEGATIVE 04/18/2010 0155   LEUKOCYTESUR NEGATIVE MICROSCOPIC NOT DONE ON URINES WITH NEGATIVE PROTEIN, BLOOD, LEUKOCYTES, NITRITE, OR GLUCOSE <1000 mg/dL. 04/18/2010 0155    RADIOGRAPHIC STUDIES: US Breast Left Status: Final result         PACS Images    Show images for US Breast Left         Study Result    CLINICAL DATA: 80 year old with known left breast cancer. The  patient is being treated with an anti estrogen.  Assess the size of  the known left breast cancer.  EXAM:  ULTRASOUND OF THE LEFT BREAST  COMPARISON: Mammogram dated 04/28/2012 and ultrasound dated  06/09/2012  FINDINGS:  On physical exam,I palpated a discrete mass in the left breast at 10  o'clock 8 cm from the nipple.  Ultrasound is performed, showing an irregular, hypoechoic mass in  the left breast at 10 o'clock 8 cm from the nipple measuring 1.7 x  1.3 x 2.1 cm. On the prior ultrasound dated 06/09/2012 it measured  2.2 x 2.0 x 2.1 cm. Sonographic evaluation of the left axilla did  not reveal any enlarged adenopathy.  IMPRESSION:  The known cancer in the upper-inner quadrant of the left breast has  decreased in size when compared to the 06/09/2012 study.  RECOMMENDATION:  Treatment plan.  I have discussed the findings and recommendations with the patient.  Results were also provided in writing at the conclusion of the  visit. If applicable, a reminder letter will be sent to the patient  regarding the next appointment.  BI-RADS CATEGORY 6: Known biopsy-proven malignancy - appropriate  action should be taken.  Electronically Signed  By: Lillia Mountain M.D.  On: 12/29/2012      ASSESSMENT:  #1.80 y.o. female returns for regular visit for followup of Clinical stage IIA (T2, N0, M0) invasive ductal carcinoma ER 100%, PR 0%, Ki-67 32%, Her2 not amplified who was started neoadjuvantly on Arimidex 1 mg on 06/17/2012 daily by Dr. Willy Eddy for ~6 months to decrease tumor burden in hopes of making improving surgical resection outcome. Her surgeon is Dr. Lucia Gaskins better tolerance of exemestane with evidence of clinical response, no longer palpable on physical exam today  #2. Osteoporosis, on yearly Reclast with last dose given 07/20/2012.  #3. Skin rash, resolved after treatment for scabies and changing aromatase inhibitor(Exemestane).  #4. Stage 0 chronic lymphocytic leukemia, stable #5. Left cerebral CVA with right-sided weakness, lower  extremity worse than upper extremity.    PLAN:  #1. Continue exemestane 25 mg daily and repeat ultrasound the left breast in 3 months just prior to next visit. #2. Continue to use walker at the nursing home to increase strength in right lower extremity. #3. Followup in 3 months with CBC, chem profile, LDH, beta-2 microglobulin, CEA, and CA 27-29.   All questions were answered. The patient knows to call the clinic with any problems, questions or concerns. We can certainly see the patient much sooner if necessary.   I spent 25 minutes counseling the patient face to face. The total time spent in the appointment was 30 minutes.  Doroteo Bradford, MD 03/22/2013 6:37 AM

## 2013-03-21 NOTE — Progress Notes (Signed)
See note #1

## 2013-03-22 LAB — VITAMIN D 25 HYDROXY (VIT D DEFICIENCY, FRACTURES): Vit D, 25-Hydroxy: 36 ng/mL (ref 30–89)

## 2013-03-22 LAB — CEA: CEA: 3.4 ng/mL (ref 0.0–5.0)

## 2013-03-22 LAB — CANCER ANTIGEN 27.29: CA 27.29: 20 U/mL (ref 0–39)

## 2013-03-25 LAB — BETA 2 MICROGLOBULIN, SERUM: BETA 2 MICROGLOBULIN: 5.5 mg/L — AB (ref <=2.51)

## 2013-03-30 ENCOUNTER — Ambulatory Visit (HOSPITAL_COMMUNITY): Payer: Medicare Other

## 2013-05-04 ENCOUNTER — Other Ambulatory Visit (HOSPITAL_COMMUNITY): Payer: Self-pay | Admitting: Hematology and Oncology

## 2013-05-04 ENCOUNTER — Ambulatory Visit (HOSPITAL_COMMUNITY)
Admission: RE | Admit: 2013-05-04 | Discharge: 2013-05-04 | Disposition: A | Discharge status: Home / Self Care | Payer: Medicare Other | Source: Ambulatory Visit | Attending: Hematology and Oncology | Admitting: Hematology and Oncology

## 2013-05-04 DIAGNOSIS — C50912 Malignant neoplasm of unspecified site of left female breast: Secondary | ICD-10-CM

## 2013-05-04 DIAGNOSIS — C9110 Chronic lymphocytic leukemia of B-cell type not having achieved remission: Secondary | ICD-10-CM

## 2013-05-04 DIAGNOSIS — M810 Age-related osteoporosis without current pathological fracture: Secondary | ICD-10-CM

## 2013-05-04 DIAGNOSIS — C50919 Malignant neoplasm of unspecified site of unspecified female breast: Secondary | ICD-10-CM | POA: Insufficient documentation

## 2013-06-21 ENCOUNTER — Ambulatory Visit (HOSPITAL_COMMUNITY): Payer: Medicare Other

## 2013-07-12 ENCOUNTER — Encounter (HOSPITAL_COMMUNITY): Payer: Medicare Other | Attending: Hematology and Oncology

## 2013-07-12 ENCOUNTER — Encounter (HOSPITAL_COMMUNITY): Payer: Self-pay

## 2013-07-12 VITALS — BP 122/70 | HR 114 | Temp 97.3°F | Resp 18 | Wt 198.0 lb

## 2013-07-12 DIAGNOSIS — I69998 Other sequelae following unspecified cerebrovascular disease: Secondary | ICD-10-CM

## 2013-07-12 DIAGNOSIS — F0390 Unspecified dementia without behavioral disturbance: Secondary | ICD-10-CM | POA: Insufficient documentation

## 2013-07-12 DIAGNOSIS — Z856 Personal history of leukemia: Secondary | ICD-10-CM | POA: Diagnosis not present

## 2013-07-12 DIAGNOSIS — C50319 Malignant neoplasm of lower-inner quadrant of unspecified female breast: Secondary | ICD-10-CM

## 2013-07-12 DIAGNOSIS — Z79811 Long term (current) use of aromatase inhibitors: Secondary | ICD-10-CM | POA: Insufficient documentation

## 2013-07-12 DIAGNOSIS — R5383 Other fatigue: Secondary | ICD-10-CM

## 2013-07-12 DIAGNOSIS — C50912 Malignant neoplasm of unspecified site of left female breast: Secondary | ICD-10-CM

## 2013-07-12 DIAGNOSIS — R5381 Other malaise: Secondary | ICD-10-CM | POA: Insufficient documentation

## 2013-07-12 DIAGNOSIS — E1142 Type 2 diabetes mellitus with diabetic polyneuropathy: Secondary | ICD-10-CM | POA: Diagnosis not present

## 2013-07-12 DIAGNOSIS — Z170 Estrogen receptor positive status [ER+]: Secondary | ICD-10-CM | POA: Diagnosis not present

## 2013-07-12 DIAGNOSIS — E1149 Type 2 diabetes mellitus with other diabetic neurological complication: Secondary | ICD-10-CM | POA: Diagnosis not present

## 2013-07-12 DIAGNOSIS — Z87891 Personal history of nicotine dependence: Secondary | ICD-10-CM | POA: Insufficient documentation

## 2013-07-12 DIAGNOSIS — G1221 Amyotrophic lateral sclerosis: Secondary | ICD-10-CM | POA: Insufficient documentation

## 2013-07-12 DIAGNOSIS — C9110 Chronic lymphocytic leukemia of B-cell type not having achieved remission: Secondary | ICD-10-CM

## 2013-07-12 DIAGNOSIS — M810 Age-related osteoporosis without current pathological fracture: Secondary | ICD-10-CM

## 2013-07-12 DIAGNOSIS — C50919 Malignant neoplasm of unspecified site of unspecified female breast: Secondary | ICD-10-CM | POA: Diagnosis present

## 2013-07-12 LAB — COMPREHENSIVE METABOLIC PANEL
ALT: 10 U/L (ref 0–35)
AST: 14 U/L (ref 0–37)
Albumin: 3.7 g/dL (ref 3.5–5.2)
Alkaline Phosphatase: 89 U/L (ref 39–117)
BILIRUBIN TOTAL: 0.3 mg/dL (ref 0.3–1.2)
BUN: 19 mg/dL (ref 6–23)
CHLORIDE: 100 meq/L (ref 96–112)
CO2: 25 meq/L (ref 19–32)
Calcium: 9.2 mg/dL (ref 8.4–10.5)
Creatinine, Ser: 1.37 mg/dL — ABNORMAL HIGH (ref 0.50–1.10)
GFR calc Af Amer: 40 mL/min — ABNORMAL LOW (ref >90)
GFR calc non Af Amer: 34 mL/min — ABNORMAL LOW (ref >90)
Glucose, Bld: 288 mg/dL — ABNORMAL HIGH (ref 70–99)
Potassium: 4.2 mEq/L (ref 3.7–5.3)
Sodium: 139 mEq/L (ref 137–147)
Total Protein: 6.6 g/dL (ref 6.0–8.3)

## 2013-07-12 LAB — CBC WITH DIFFERENTIAL/PLATELET
Basophils Absolute: 0 10*3/uL (ref 0.0–0.1)
Basophils Relative: 0 % (ref 0–1)
EOS ABS: 0.1 10*3/uL (ref 0.0–0.7)
EOS PCT: 1 % (ref 0–5)
HCT: 41 % (ref 36.0–46.0)
HEMOGLOBIN: 14.2 g/dL (ref 12.0–15.0)
LYMPHS PCT: 62 % — AB (ref 12–46)
Lymphs Abs: 8 10*3/uL — ABNORMAL HIGH (ref 0.7–4.0)
MCH: 32.1 pg (ref 26.0–34.0)
MCHC: 34.6 g/dL (ref 30.0–36.0)
MCV: 92.6 fL (ref 78.0–100.0)
MONO ABS: 0.5 10*3/uL (ref 0.1–1.0)
Monocytes Relative: 4 % (ref 3–12)
NEUTROS PCT: 33 % — AB (ref 43–77)
Neutro Abs: 4.3 10*3/uL (ref 1.7–7.7)
Platelets: 148 10*3/uL — ABNORMAL LOW (ref 150–400)
RBC: 4.43 MIL/uL (ref 3.87–5.11)
RDW: 13.4 % (ref 11.5–15.5)
WBC: 12.9 10*3/uL — AB (ref 4.0–10.5)

## 2013-07-12 LAB — RETICULOCYTES
RBC.: 4.43 MIL/uL (ref 3.87–5.11)
RETIC COUNT ABSOLUTE: 75.3 10*3/uL (ref 19.0–186.0)
Retic Ct Pct: 1.7 % (ref 0.4–3.1)

## 2013-07-12 LAB — LACTATE DEHYDROGENASE: LDH: 165 U/L (ref 94–250)

## 2013-07-12 NOTE — Patient Instructions (Signed)
[facility] Discharge Instructions  RECOMMENDATIONS MADE BY THE CONSULTANT AND ANY TEST RESULTS WILL BE SENT TO YOUR REFERRING PHYSICIAN.  EXAM FINDINGS BY THE PHYSICIAN TODAY AND SIGNS OR SYMPTOMS TO REPORT TO CLINIC OR PRIMARY PHYSICIAN: Exam and findings as discussed by Dr. Barnet Glasgow.   INSTRUCTIONS/FOLLOW-UP: Ultrasound in end of September Return to see the doctor in 3 months with the same lab work repeated  Thank you for choosing [facility] to provide your oncology and hematology care.  To afford each patient quality time with our providers, please arrive at least 15 minutes before your scheduled appointment time.  With your help, our goal is to use those 15 minutes to complete the necessary work-up to ensure our physicians have the information they need to help with your evaluation and healthcare recommendations.    Effective January 1st, 2014, we ask that you re-schedule your appointment with our physicians should you arrive 10 or more minutes late for your appointment.  We strive to give you quality time with our providers, and arriving late affects you and other patients whose appointments are after yours.    Again, thank you for choosing [facility].  Our hope is that these requests will decrease the amount of time that you wait before being seen by our physicians.       _____________________________________________________________  Should you have questions after your visit to [facility], please contact our office at [phone] between the hours of 8:30 a.m. and 4:30 p.m.  Voicemails left after 4:30 p.m. will not be returned until the following business day.  For prescription refill requests, have your pharmacy contact our office with your prescription refill request.    _______________________________________________________________  We hope that we have given you very good care.  You may receive a patient  satisfaction survey in the mail, please complete it and return it as soon as possible.  We value your feedback!  _______________________________________________________________  Have you asked about our STAR program?  STAR stands for Survivorship Training and Rehabilitation, and this is a nationally recognized cancer care program that focuses on survivorship and rehabilitation.  Cancer and cancer treatments may cause problems, such as, pain, making you feel tired and keeping you from doing the things that you need or want to do. Cancer rehabilitation can help. Our goal is to reduce these troubling effects and help you have the best quality of life possible.  You may receive a survey from a nurse that asks questions about your current state of health.  Based on the survey results, all eligible patients will be referred to the [facility] program for an evaluation so we can better serve you!  A frequently asked questions sheet is available upon request.

## 2013-07-12 NOTE — Progress Notes (Signed)
[facility]  OFFICE PROGRESS NOTE  Purvis Kilts, MD [place] [address]  DIAGNOSIS: Breast cancer, left  Chronic lymphocytic leukemia (CLL), B-cell - Plan: CBC with Differential, Reticulocytes, Lactate dehydrogenase, Comprehensive metabolic panel, Beta 2 microglobuline, serum  Osteoporosis  Invasive ductal carcinoma of left breast - Plan: CBC with Differential, Comprehensive metabolic panel, CEA, Cancer antigen 27.29  Chief Complaint  Patient presents with  . Left breast cancer  . Chronic lymphocytic leukemia  . Follow-up    CURRENT THERAPY: Neoadjuvant exemestane for left breast cancer diagnosed on 05/05/2012 treated surgically with biopsy only.  INTERVAL HISTORY: [name] 80 y.o. female returns for followup of left breast cancer, ER positive, status post biopsy in April of 2014 being treated neo-adjuvantly with exemestane with most recent ultrasound done on 05/04/2013 showing decrease in size. She also has history of chronic lymphocytic leukemia treated in the past in 2010 along with ALS currently living in [place] assisted living. She is also suffered a stroke in the past with right-sided weakness since November of 2011 and peripheral neuropathy secondary to diabetes. The patient is very forgetful for recent events. She has a tendency to confabulate. She does admit to some left upper quadrant bowel pain. She denies any headache, fever, night sweats, but does have occasional urinary incontinence without significant lower extremity edema, nausea, vomiting, diarrhea, constipation, melena, hematochezia, hematuria, bone pain, skin rash, headache, or seizures.    MEDICAL HISTORY: Past Medical History  Diagnosis Date  . Diabetes mellitus   . Pneumonia     history  . Kidney stones     histroy  . Hx: UTI (urinary tract infection)   . CVA (cerebral infarction) 09/2009  . Breast cancer 05/05/12    left,  ER+/PR-, Her 2 -  . Leukemia     CLL, Dr Tressie Stalker, completed tx 2010  . COPD (chronic obstructive pulmonary disease)   . Hypothyroidism   . Stroke     x 3, last Nov 2011, right side weakness  . Neuropathy     diabetic- right side  . Invasive ductal carcinoma of left breast 05/13/2012  . Osteoporosis, unspecified 10/20/2012    INTERIM HISTORY: has CLL; POLYP, COLON; Diabetes insipidus; HYPERLIPIDEMIA; CVA; TIA; COPD; DUODENAL ULCER; GASTRITIS, ACUTE; RENAL CALCULUS; DYSPNEA; S/P bilateral hip replacements; Back pain; Invasive ductal carcinoma of left breast; CAP (community acquired pneumonia); COPD with exacerbation; and Osteoporosis, unspecified on her problem list.    ALLERGIES:  is allergic to arimidex; cephalexin; levaquin; cranberry juice powder; avelox; ciprofloxacin; codeine; ibuprofen; lipitor; morphine sulfate; nitrofurantoin monohyd macro; paroxetine hcl; penicillins; salicylic acid; sulfa antibiotics; and vioxx.  MEDICATIONS: has a current medication list which includes the following prescription(s): acetaminophen, albuterol, bifidobacterium infantis, calcium carbonate-vitamin d, clobetasol cream, exemestane, hydrocodone-acetaminophen, loratadine, pantoprazole, sitagliptin, triamcinolone cream, and fluticasone.  SURGICAL HISTORY:  Past Surgical History  Procedure Laterality Date  . Cardiac catheterization    . Lithotripsy    . Breast lumpectomy      left  breast  . Replacement total knee      right  . Appendectomy    . Ovarian cyst removal    . Total hip arthroplasty      left  . Bartholin gland cyst excision    . Total hip arthroplasty      right  . Cholecystectomy    . Abdominal hysterectomy      TAH-BSO, endometriosis  . Core biopsy left  breast  04/2012  . Kidney stone surgery Right 1979    In [place], [place]    FAMILY HISTORY: family history includes Cancer in her brother and father; Diabetes in an other family member; Kidney disease in her brother and another  family member.  SOCIAL HISTORY:  reports that she quit smoking about 21 years ago. She has never used smokeless tobacco. She reports that she drinks alcohol. She reports that she does not use illicit drugs.  REVIEW OF SYSTEMS:  Other than that discussed above is noncontributory.  PHYSICAL EXAMINATION: ECOG PERFORMANCE STATUS: 1 - Symptomatic but completely ambulatory  Blood pressure 122/70, pulse 114, temperature 97.3 F (36.3 C), resp. rate 18, weight 198 lb (89.812 kg).  GENERAL:alert, no distress and comfortable SKIN: skin color, texture, turgor are normal, no rashes or significant lesions EYES: PERLA; Conjunctiva are pink and non-injected, sclera clear SINUSES: No redness or tenderness over maxillary or ethmoid sinuses OROPHARYNX:no exudate, no erythema on lips, buccal mucosa, or tongue. NECK: supple, thyroid normal size, non-tender, without nodularity. No masses CHEST: Right breast without mass. Left breast with a 1 cm mass in the lower inner quadrant of the left breast without lymphadenopathy. LYMPH:  no palpable lymphadenopathy in the cervical, axillary or inguinal LUNGS: clear to auscultation and percussion with normal breathing effort HEART: regular rate & rhythm and no murmurs. ABDOMEN:abdomen soft, non-tender and normal bowel sounds. Spleen not palpable. MUSCULOSKELETAL:no cyanosis of digits and no clubbing. Range of motion normal.  NEURO: alert & oriented x 3 with fluent speech, right lower extremity 3/5 weakness. Right upper extremity 4/5 weakness.   LABORATORY DATA: Office Visit on 07/12/2013  Component Date Value Ref Range Status  . WBC 07/12/2013 12.9* 4.0 - 10.5 K/uL Final  . RBC 07/12/2013 4.43  3.87 - 5.11 MIL/uL Final  . Hemoglobin 07/12/2013 14.2  12.0 - 15.0 g/dL Final  . HCT 07/12/2013 41.0  36.0 - 46.0 % Final  . MCV 07/12/2013 92.6  78.0 - 100.0 fL Final  . MCH 07/12/2013 32.1  26.0 - 34.0 pg Final  . MCHC 07/12/2013 34.6  30.0 - 36.0 g/dL Final  . RDW  07/12/2013 13.4  11.5 - 15.5 % Final  . Platelets 07/12/2013 148* 150 - 400 K/uL Final  . Neutrophils Relative % 07/12/2013 33* 43 - 77 % Final  . Lymphocytes Relative 07/12/2013 62* 12 - 46 % Final  . Monocytes Relative 07/12/2013 4  3 - 12 % Final  . Eosinophils Relative 07/12/2013 1  0 - 5 % Final  . Basophils Relative 07/12/2013 0  0 - 1 % Final  . Neutro Abs 07/12/2013 4.3  1.7 - 7.7 K/uL Final  . Lymphs Abs 07/12/2013 8.0* 0.7 - 4.0 K/uL Final  . Monocytes Absolute 07/12/2013 0.5  0.1 - 1.0 K/uL Final  . Eosinophils Absolute 07/12/2013 0.1  0.0 - 0.7 K/uL Final  . Basophils Absolute 07/12/2013 0.0  0.0 - 0.1 K/uL Final  . WBC Morphology 07/12/2013 ABSOLUTE LYMPHOCYTOSIS   Final   Comment: ATYPICAL LYMPHOCYTES                          SMUDGE CELLS  . Smear Review 07/12/2013 LARGE PLATELETS PRESENT   Final  . Retic Ct Pct 07/12/2013 1.7  0.4 - 3.1 % Final  . RBC. 07/12/2013 4.43  3.87 - 5.11 MIL/uL Final  . Retic Count, Manual 07/12/2013 75.3  19.0 - 186.0 K/uL Final  . LDH 07/12/2013 165  94 - 250 U/L Final  . Sodium 07/12/2013 139  137 - 147 mEq/L Final  . Potassium 07/12/2013 4.2  3.7 - 5.3 mEq/L Final  . Chloride 07/12/2013 100  96 - 112 mEq/L Final  . CO2 07/12/2013 25  19 - 32 mEq/L Final  . Glucose, Bld 07/12/2013 288* 70 - 99 mg/dL Final  . BUN 07/12/2013 19  6 - 23 mg/dL Final  . Creatinine, Ser 07/12/2013 1.37* 0.50 - 1.10 mg/dL Final  . Calcium 07/12/2013 9.2  8.4 - 10.5 mg/dL Final  . Total Protein 07/12/2013 6.6  6.0 - 8.3 g/dL Final  . Albumin 07/12/2013 3.7  3.5 - 5.2 g/dL Final  . AST 07/12/2013 14  0 - 37 U/L Final  . ALT 07/12/2013 10  0 - 35 U/L Final  . Alkaline Phosphatase 07/12/2013 89  39 - 117 U/L Final  . Total Bilirubin 07/12/2013 0.3  0.3 - 1.2 mg/dL Final  . GFR calc non Af Amer 07/12/2013 34* >90 mL/min Final  . GFR calc Af Amer 07/12/2013 40* >90 mL/min Final   Comment: (NOTE)                          The eGFR has been calculated using the CKD  EPI equation.                          This calculation has not been validated in all clinical situations.                          eGFR's persistently <90 mL/min signify possible Chronic Kidney                          Disease.    PATHOLOGY: No new pathology.  Urinalysis    Component Value Date/Time   COLORURINE YELLOW 04/18/2010 0155   APPEARANCEUR CLOUDY* 04/18/2010 0155   LABSPEC 1.010 04/18/2010 0155   PHURINE 6.0 04/18/2010 0155   GLUCOSEU NEGATIVE 04/18/2010 0155   HGBUR NEGATIVE 04/18/2010 0155   BILIRUBINUR NEGATIVE 04/18/2010 0155   KETONESUR NEGATIVE 04/18/2010 0155   PROTEINUR NEGATIVE 04/18/2010 0155   UROBILINOGEN 0.2 04/18/2010 0155   NITRITE NEGATIVE 04/18/2010 0155   LEUKOCYTESUR NEGATIVE MICROSCOPIC NOT DONE ON URINES WITH NEGATIVE PROTEIN, BLOOD, LEUKOCYTES, NITRITE, OR GLUCOSE <1000 mg/dL. 04/18/2010 0155    RADIOGRAPHIC STUDIES: US BREAST LTD UNI LEFT INC AXILLA Status: Final result         PACS Images    Show images for US BREAST LTD UNI LEFT INC AXILLA         Study Result    CLINICAL DATA: Follow-up evaluation of patient with known left  breast invasive mammary carcinoma which is treated with anti  estrogen therapy. There has been no surgery.  EXAM:  ULTRASOUND OF THE left BREAST  COMPARISON: 12/29/2012 06/09/2012 04/28/2012.  FINDINGS:  On physical exam,there is no discrete palpable mass present on  today's evaluation.  Ultrasound is performed, showing an irregular, hypoechoic mass  located within the left breast at 10 o'clock position 8 cm from the  nipple corresponding to the known invasive mammary carcinoma. On  today's evaluation this measures 1.8 x 1.7 x 1.0 cm in size and  previously measured 2.1 x 1.7 x 1.3 cm in size (mild interval  decrease in size). There is no other significant change.  IMPRESSION:  Mild interval decrease in size of the known left breast invasive  mammary carcinoma which now measures 1.8 x 1.7 x 1.0 cm in size.  RECOMMENDATION:    Treatment plan.  I have discussed the findings and recommendations with the patient.  Results were also provided in writing at the conclusion of the  visit. If applicable, a reminder letter will be sent to the patient  regarding the next appointment.  BI-RADS CATEGORY 6: Known biopsy-proven malignancy.  Electronically Signed  By: Luberta Robertson M.D.  On: 05/04/2013 13:01      ASSESSMENT:  #1.Clinical stage IIA (T2, N0, M0) invasive ductal carcinoma ER 100%, PR 0%, Ki-67 32%, Her2 not amplified who was started neoadjuvantly on Arimidex 1 mg on 06/17/2012 daily by Dr. Willy Eddy and change to exemestane because of intolerance with continued improvement as evidenced by repeat ultrasound performed in April 2015. f#2. Osteoporosis, on yearly Reclast with last dose in July 2014. #3. Chronic lymphocytic leukemia, stage 0, stable. #4. Left cerebral CVA with right-sided weakness, lower extremity worse than upper. #5. Dementia.   PLAN:  #1. Continue exemestane 25 mg daily. #2. Repeat ultrasound left breast 10/05/2013. #3. Followup 10/12/2013 with CBC, chem profile, CEA, CA 27-29, beta 2 microglobulin, LDH, and reticulocyte count.   All questions were answered. The patient knows to call the clinic with any problems, questions or concerns. We can certainly see the patient much sooner if necessary.   I spent 25 minutes counseling the patient face to face. The total time spent in the appointment was 30 minutes.    Doroteo Bradford, MD 07/13/2013 6:44 AM  DISCLAIMER:  This note was dictated with voice recognition software.  Similar sounding words can inadvertently be transcribed inaccurately and may not be corrected upon review.

## 2013-07-12 NOTE — Progress Notes (Signed)
[name] presented for [employer]. Labs per MD order drawn via Peripheral Line 25 gauge needle inserted in lt ac  Good blood return present. Procedure without incident.  Needle removed intact. Patient tolerated procedure well.

## 2013-07-13 LAB — CEA: CEA: 3.8 ng/mL (ref 0.0–5.0)

## 2013-07-13 LAB — CANCER ANTIGEN 27.29: CA 27.29: 19 U/mL (ref 0–39)

## 2013-07-14 LAB — BETA 2 MICROGLOBULIN, SERUM: Beta-2 Microglobulin: 4.85 mg/L — ABNORMAL HIGH (ref <=2.51)

## 2013-09-09 ENCOUNTER — Other Ambulatory Visit (HOSPITAL_COMMUNITY): Payer: Self-pay | Admitting: Family Medicine

## 2013-09-09 DIAGNOSIS — R609 Edema, unspecified: Secondary | ICD-10-CM

## 2013-09-23 ENCOUNTER — Ambulatory Visit (HOSPITAL_COMMUNITY)
Admission: RE | Admit: 2013-09-23 | Discharge: 2013-09-23 | Disposition: A | Discharge status: Home / Self Care | Payer: Medicare Other | Source: Ambulatory Visit | Attending: Family Medicine | Admitting: Family Medicine

## 2013-09-23 DIAGNOSIS — M7989 Other specified soft tissue disorders: Secondary | ICD-10-CM | POA: Insufficient documentation

## 2013-09-23 DIAGNOSIS — R609 Edema, unspecified: Secondary | ICD-10-CM

## 2013-10-04 ENCOUNTER — Ambulatory Visit (HOSPITAL_COMMUNITY): Admission: RE | Admit: 2013-10-04 | Payer: Medicare Other | Source: Ambulatory Visit

## 2013-10-11 ENCOUNTER — Encounter (HOSPITAL_COMMUNITY): Payer: Self-pay | Admitting: Oncology

## 2013-10-11 ENCOUNTER — Other Ambulatory Visit (HOSPITAL_COMMUNITY): Payer: Self-pay | Admitting: Oncology

## 2013-10-11 DIAGNOSIS — M810 Age-related osteoporosis without current pathological fracture: Secondary | ICD-10-CM

## 2013-10-12 ENCOUNTER — Encounter (HOSPITAL_COMMUNITY): Payer: Self-pay

## 2013-10-12 ENCOUNTER — Encounter (HOSPITAL_COMMUNITY): Payer: Medicare Other | Attending: Hematology and Oncology

## 2013-10-12 ENCOUNTER — Encounter (HOSPITAL_BASED_OUTPATIENT_CLINIC_OR_DEPARTMENT_OTHER): Payer: Medicare Other

## 2013-10-12 VITALS — BP 156/38 | HR 60 | Temp 97.8°F | Resp 20

## 2013-10-12 DIAGNOSIS — Z853 Personal history of malignant neoplasm of breast: Secondary | ICD-10-CM

## 2013-10-12 DIAGNOSIS — Z170 Estrogen receptor positive status [ER+]: Secondary | ICD-10-CM | POA: Insufficient documentation

## 2013-10-12 DIAGNOSIS — C50912 Malignant neoplasm of unspecified site of left female breast: Secondary | ICD-10-CM

## 2013-10-12 DIAGNOSIS — E039 Hypothyroidism, unspecified: Secondary | ICD-10-CM | POA: Insufficient documentation

## 2013-10-12 DIAGNOSIS — E1142 Type 2 diabetes mellitus with diabetic polyneuropathy: Secondary | ICD-10-CM | POA: Insufficient documentation

## 2013-10-12 DIAGNOSIS — E1149 Type 2 diabetes mellitus with other diabetic neurological complication: Secondary | ICD-10-CM | POA: Diagnosis not present

## 2013-10-12 DIAGNOSIS — F0390 Unspecified dementia without behavioral disturbance: Secondary | ICD-10-CM | POA: Diagnosis not present

## 2013-10-12 DIAGNOSIS — C9110 Chronic lymphocytic leukemia of B-cell type not having achieved remission: Secondary | ICD-10-CM | POA: Diagnosis not present

## 2013-10-12 DIAGNOSIS — J449 Chronic obstructive pulmonary disease, unspecified: Secondary | ICD-10-CM | POA: Diagnosis not present

## 2013-10-12 DIAGNOSIS — M810 Age-related osteoporosis without current pathological fracture: Secondary | ICD-10-CM | POA: Insufficient documentation

## 2013-10-12 DIAGNOSIS — C50919 Malignant neoplasm of unspecified site of unspecified female breast: Secondary | ICD-10-CM | POA: Diagnosis not present

## 2013-10-12 DIAGNOSIS — Z8673 Personal history of transient ischemic attack (TIA), and cerebral infarction without residual deficits: Secondary | ICD-10-CM | POA: Diagnosis not present

## 2013-10-12 DIAGNOSIS — J4489 Other specified chronic obstructive pulmonary disease: Secondary | ICD-10-CM | POA: Insufficient documentation

## 2013-10-12 DIAGNOSIS — F0280 Dementia in other diseases classified elsewhere without behavioral disturbance: Secondary | ICD-10-CM

## 2013-10-12 LAB — COMPREHENSIVE METABOLIC PANEL
ALT: 10 U/L (ref 0–35)
ANION GAP: 11 (ref 5–15)
AST: 12 U/L (ref 0–37)
Albumin: 3.8 g/dL (ref 3.5–5.2)
Alkaline Phosphatase: 91 U/L (ref 39–117)
BILIRUBIN TOTAL: 0.5 mg/dL (ref 0.3–1.2)
BUN: 23 mg/dL (ref 6–23)
CHLORIDE: 102 meq/L (ref 96–112)
CO2: 25 meq/L (ref 19–32)
Calcium: 9.7 mg/dL (ref 8.4–10.5)
Creatinine, Ser: 1.26 mg/dL — ABNORMAL HIGH (ref 0.50–1.10)
GFR calc Af Amer: 44 mL/min — ABNORMAL LOW (ref >90)
GFR, EST NON AFRICAN AMERICAN: 38 mL/min — AB (ref >90)
Glucose, Bld: 286 mg/dL — ABNORMAL HIGH (ref 70–99)
POTASSIUM: 4.2 meq/L (ref 3.7–5.3)
Sodium: 138 mEq/L (ref 137–147)
Total Protein: 6.8 g/dL (ref 6.0–8.3)

## 2013-10-12 LAB — CBC WITH DIFFERENTIAL/PLATELET
Basophils Absolute: 0 10*3/uL (ref 0.0–0.1)
Basophils Relative: 0 % (ref 0–1)
EOS PCT: 1 % (ref 0–5)
Eosinophils Absolute: 0.1 10*3/uL (ref 0.0–0.7)
HEMATOCRIT: 39.8 % (ref 36.0–46.0)
HEMOGLOBIN: 13.9 g/dL (ref 12.0–15.0)
LYMPHS ABS: 8.5 10*3/uL — AB (ref 0.7–4.0)
Lymphocytes Relative: 61 % — ABNORMAL HIGH (ref 12–46)
MCH: 32.2 pg (ref 26.0–34.0)
MCHC: 34.9 g/dL (ref 30.0–36.0)
MCV: 92.1 fL (ref 78.0–100.0)
MONO ABS: 0.3 10*3/uL (ref 0.1–1.0)
Monocytes Relative: 2 % — ABNORMAL LOW (ref 3–12)
NEUTROS ABS: 5 10*3/uL (ref 1.7–7.7)
Neutrophils Relative %: 36 % — ABNORMAL LOW (ref 43–77)
Platelets: 136 10*3/uL — ABNORMAL LOW (ref 150–400)
RBC: 4.32 MIL/uL (ref 3.87–5.11)
RDW: 13.4 % (ref 11.5–15.5)
WBC: 13.9 10*3/uL — AB (ref 4.0–10.5)

## 2013-10-12 LAB — LACTATE DEHYDROGENASE: LDH: 150 U/L (ref 94–250)

## 2013-10-12 NOTE — Progress Notes (Signed)
Labs for b35m,vd25,ca2729,ldh,cea,cbcd,cmp

## 2013-10-12 NOTE — Progress Notes (Signed)
[place]  OFFICE PROGRESS NOTE  Purvis Kilts, MD [place] [address]  DIAGNOSIS: Breast cancer, left - Plan: CBC with Differential, Comprehensive metabolic panel, CEA, Cancer antigen 27.29, US BREAST COMPLETE UNI LEFT INC AXILLA  Chronic lymphocytic leukemia (CLL), B-cell - Plan: Lactate dehydrogenase, Beta 2 microglobulin, serum  Osteoporosis - Plan: Vitamin D 25 hydroxy  Chief Complaint  Patient presents with  . Breast Cancer  . CLL    CURRENT THERAPY: Neoadjuvant eczema stable of breast cancer diagnosed on 05/05/2012 and treated surgically with biopsy only.  INTERVAL HISTORY: [name] 80 y.o. female returns for followup of left breast cancer, ER positive, status post biopsy in April of 2014 being treated neo-adjuvantly with exemestane with most recent ultrasound done on 05/04/2013 showing decrease in size also has history of chronic lymphocytic leukemia treated in the past in 2010 currently living in [facility]. She has also suffered a stroke in the past with right-sided weakness since November of 2011 and peripheral neuropathy secondary to diabetes. Appetite is good with no nausea vomiting. She does get muscle aches typically on the right side as well as joint discomfort but not throughout the body. She denies any fever, night sweats, vaginal dryness, dysuria, hematuria, urinary hesitancy, melena, hematochezia, hematuria, chest pain, PND, orthopnea, palpitations, headache, or seizures.     MEDICAL HISTORY: Past Medical History  Diagnosis Date  . Diabetes mellitus   . Pneumonia     history  . Kidney stones     histroy  . Hx: UTI (urinary tract infection)   . CVA (cerebral infarction) 09/2009  . Breast cancer 05/05/12    left, ER+/PR-, Her 2 -  . Leukemia     CLL, Dr Tressie Stalker, completed tx 2010  . COPD (chronic obstructive pulmonary disease)   . Hypothyroidism   . Stroke     x 3, last Nov 2011, right side weakness  . Neuropathy     diabetic- right side  . Invasive ductal carcinoma of left breast 05/13/2012  . Osteoporosis, unspecified 10/20/2012  . Osteoporosis, unspecified 10/11/2013    INTERIM HISTORY: has CLL; POLYP, COLON; Diabetes insipidus; HYPERLIPIDEMIA; CVA; TIA; COPD; DUODENAL ULCER; GASTRITIS, ACUTE; RENAL CALCULUS; DYSPNEA; S/P bilateral hip replacements; Back pain; Invasive ductal carcinoma of left breast; CAP (community acquired pneumonia); COPD with exacerbation; and Osteoporosis, unspecified on her problem list.    ALLERGIES:  is allergic to arimidex; cephalexin; levaquin; cranberry juice powder; avelox; ciprofloxacin; codeine; ibuprofen; lipitor; morphine sulfate; nitrofurantoin monohyd macro; paroxetine hcl; penicillins; salicylic acid; sulfa antibiotics; and vioxx.  MEDICATIONS: has a current medication list which includes the following prescription(s): acetaminophen, albuterol, bifidobacterium infantis, calcium carbonate-vitamin d, clobetasol cream, exemestane, hydrocodone-acetaminophen, loratadine, pantoprazole, sitagliptin, triamcinolone cream, and fluticasone.  SURGICAL HISTORY:  Past Surgical History  Procedure Laterality Date  . Cardiac catheterization    . Lithotripsy    . Breast lumpectomy      left  breast  . Replacement total knee      right  . Appendectomy    . Ovarian cyst removal    . Total hip arthroplasty      left  . Bartholin gland cyst excision    . Total hip arthroplasty      right  . Cholecystectomy    . Abdominal hysterectomy      TAH-BSO, endometriosis  . Core biopsy left breast  04/2012  . Kidney stone surgery Right 1979  In [place], [place]: family history includes Cancer in her brother and father; Diabetes in an other family member; Kidney disease in her brother and another family member.  SOCIAL HISTORY:  reports that she quit smoking about 21 years ago. She has never used smokeless tobacco.  She reports that she drinks alcohol. She reports that she does not use illicit drugs.  REVIEW OF SYSTEMS:  Other than that discussed above is noncontributory.  PHYSICAL EXAMINATION: ECOG PERFORMANCE STATUS: 2 - Symptomatic, <50% confined to bed  Blood pressure 156/38, pulse 60, temperature 97.8 F (36.6 C), temperature source Oral, resp. rate 20, SpO2 100.00%.  GENERAL:alert, no distress and comfortable SKIN: skin color, texture, turgor are normal, no rashes or significant lesions EYES: PERLA; Conjunctiva are pink and non-injected, sclera clear SINUSES: No redness or tenderness over maxillary or ethmoid sinuses OROPHARYNX:no exudate, no erythema on lips, buccal mucosa, or tongue. NECK: supple, thyroid normal size, non-tender, without nodularity. No masses CHEST: Right breast without mass. Previously noted mass in the left breast is not palpable. LYMPH:  no palpable lymphadenopathy in the cervical, axillary or inguinal LUNGS: clear to auscultation and percussion with normal breathing effort HEART: regular rate & rhythm and no murmurs. ABDOMEN:abdomen soft, non-tender and normal bowel sounds MUSCULOSKELETAL:no cyanosis of digits and no clubbing. Range of motion normal.  NEURO: alert & oriented x 3 with fluent speech, no focal motor/sensory deficits. 4 we'll 5 right-sided weakness.   LABORATORY DATA: Lab on 10/12/2013  Component Date Value Ref Range Status  . WBC 10/12/2013 13.9* 4.0 - 10.5 K/uL Final  . RBC 10/12/2013 4.32  3.87 - 5.11 MIL/uL Final  . Hemoglobin 10/12/2013 13.9  12.0 - 15.0 g/dL Final  . HCT 10/12/2013 39.8  36.0 - 46.0 % Final  . MCV 10/12/2013 92.1  78.0 - 100.0 fL Final  . MCH 10/12/2013 32.2  26.0 - 34.0 pg Final  . MCHC 10/12/2013 34.9  30.0 - 36.0 g/dL Final  . RDW 10/12/2013 13.4  11.5 - 15.5 % Final  . Platelets 10/12/2013 136* 150 - 400 K/uL Final  . Neutrophils Relative % 10/12/2013 36* 43 - 77 % Final  . Neutro Abs 10/12/2013 5.0  1.7 - 7.7 K/uL Final    . Lymphocytes Relative 10/12/2013 61* 12 - 46 % Final  . Lymphs Abs 10/12/2013 8.5* 0.7 - 4.0 K/uL Final  . Monocytes Relative 10/12/2013 2* 3 - 12 % Final  . Monocytes Absolute 10/12/2013 0.3  0.1 - 1.0 K/uL Final  . Eosinophils Relative 10/12/2013 1  0 - 5 % Final  . Eosinophils Absolute 10/12/2013 0.1  0.0 - 0.7 K/uL Final  . Basophils Relative 10/12/2013 0  0 - 1 % Final  . Basophils Absolute 10/12/2013 0.0  0.0 - 0.1 K/uL Final  . WBC Morphology 10/12/2013 ABSOLUTE LYMPHOCYTOSIS   Final   ATYPICAL LYMPHOCYTES  . Sodium 10/12/2013 138  137 - 147 mEq/L Final  . Potassium 10/12/2013 4.2  3.7 - 5.3 mEq/L Final  . Chloride 10/12/2013 102  96 - 112 mEq/L Final  . CO2 10/12/2013 25  19 - 32 mEq/L Final  . Glucose, Bld 10/12/2013 286* 70 - 99 mg/dL Final  . BUN 10/12/2013 23  6 - 23 mg/dL Final  . Creatinine, Ser 10/12/2013 1.26* 0.50 - 1.10 mg/dL Final  . Calcium 10/12/2013 9.7  8.4 - 10.5 mg/dL Final  . Total Protein 10/12/2013 6.8  6.0 - 8.3 g/dL Final  . Albumin 10/12/2013 3.8  3.5 -  5.2 g/dL Final  . AST 10/12/2013 12  0 - 37 U/L Final  . ALT 10/12/2013 10  0 - 35 U/L Final  . Alkaline Phosphatase 10/12/2013 91  39 - 117 U/L Final  . Total Bilirubin 10/12/2013 0.5  0.3 - 1.2 mg/dL Final  . GFR calc non Af Amer 10/12/2013 38* >90 mL/min Final  . GFR calc Af Amer 10/12/2013 44* >90 mL/min Final   Comment: (NOTE)                          The eGFR has been calculated using the CKD EPI equation.                          This calculation has not been validated in all clinical situations.                          eGFR's persistently <90 mL/min signify possible Chronic Kidney                          Disease.  . Anion gap 10/12/2013 11  5 - 15 Final  . LDH 10/12/2013 150  94 - 250 U/L Final    PATHOLOGY: No new pathology.  Urinalysis    Component Value Date/Time   COLORURINE YELLOW 04/18/2010 0155   APPEARANCEUR CLOUDY* 04/18/2010 0155   LABSPEC 1.010 04/18/2010 0155   PHURINE 6.0  04/18/2010 0155   GLUCOSEU NEGATIVE 04/18/2010 0155   HGBUR NEGATIVE 04/18/2010 0155   BILIRUBINUR NEGATIVE 04/18/2010 0155   KETONESUR NEGATIVE 04/18/2010 0155   PROTEINUR NEGATIVE 04/18/2010 0155   UROBILINOGEN 0.2 04/18/2010 0155   NITRITE NEGATIVE 04/18/2010 0155   LEUKOCYTESUR NEGATIVE MICROSCOPIC NOT DONE ON URINES WITH NEGATIVE PROTEIN, BLOOD, LEUKOCYTES, NITRITE, OR GLUCOSE <1000 mg/dL. 04/18/2010 0155    RADIOGRAPHIC STUDIES: US Venous Img Lower Unilateral Right  09/23/2013   CLINICAL DATA:  Right lower extremity edema.  EXAM: RIGHT LOWER EXTREMITY VENOUS DOPPLER ULTRASOUND  TECHNIQUE: Gray-scale sonography with graded compression, as well as color Doppler and duplex ultrasound, were performed to evaluate the deep venous system from the level of the common femoral vein through the popliteal and proximal calf veins. Spectral Doppler was utilized to evaluate flow at rest and with distal augmentation maneuvers.  COMPARISON:  None.  FINDINGS: Normal compressibility, augmentation and color Doppler flow in the right common femoral vein, right femoral vein and right popliteal vein. Right great saphenous vein is patent. Visualized deep calf veins are patent.  IMPRESSION: Negative for right lower extremity deep vein thrombosis.   Electronically Signed   By: Markus Daft M.D.   On: 09/23/2013 15:31    ASSESSMENT:  #1.Clinical stage IIA (T2, N0, M0) invasive ductal carcinoma ER 100%, PR 0%, Ki-67 32%, Her2 not amplified who was started neoadjuvantly on Arimidex 1 mg on 06/17/2012 daily by Dr. Willy Eddy and change to exemestane because of intolerance with continued improvement as evidenced by repeat ultrasound performed in April 2015. Ultrasound left breast scheduled in September which was never done. f#2. Osteoporosis, on yearly Reclast with last dose in July 2014.  #3. Chronic lymphocytic leukemia, stage 0, stable.  #4. Left cerebral CVA with right-sided weakness, lower extremity worse than upper.  #5.  Dementia   PLAN:  #1. Attempt to schedule ultrasound left breast and axilla today. If not, as soon as possible. #2.  Continue exemestane 25 mg daily. #3. Followup in 3 months with CBC, chem profile, CEA, CA 27-29.   All questions were answered. The patient knows to call the clinic with any problems, questions or concerns. We can certainly see the patient much sooner if necessary.   I spent 25 minutes counseling the patient face to face. The total time spent in the appointment was 30 minutes.    Doroteo Bradford, MD 10/12/2013 2:18 PM  DISCLAIMER:  This note was dictated with voice recognition software.  Similar sounding words can inadvertently be transcribed inaccurately and may not be corrected upon review.

## 2013-10-12 NOTE — Patient Instructions (Addendum)
[facility] Discharge Instructions  RECOMMENDATIONS MADE BY THE CONSULTANT AND ANY TEST RESULTS WILL BE SENT TO YOUR REFERRING PHYSICIAN.  EXAM FINDINGS BY THE PHYSICIAN TODAY AND SIGNS OR SYMPTOMS TO REPORT TO CLINIC OR PRIMARY PHYSICIAN: Exam and findings as discussed by Dr. Barnet Glasgow.  MD not able to palpate the left breast mass.  Will get ultrasound of her breast rescheduled.  This can only be done on Tuesdays.  MEDICATIONS PRESCRIBED:  Continue Exemestane  INSTRUCTIONS/FOLLOW-UP: Ultrasound on Tuesday, 10/25/13 at 9 am - she will need to be in xray at 8:45am. Follow-up in 3 months with labs and office visit.  Thank you for choosing [facility] to provide your oncology and hematology care.  To afford each patient quality time with our providers, please arrive at least 15 minutes before your scheduled appointment time.  With your help, our goal is to use those 15 minutes to complete the necessary work-up to ensure our physicians have the information they need to help with your evaluation and healthcare recommendations.    Effective January 1st, 2014, we ask that you re-schedule your appointment with our physicians should you arrive 10 or more minutes late for your appointment.  We strive to give you quality time with our providers, and arriving late affects you and other patients whose appointments are after yours.    Again, thank you for choosing [facility]  Our hope is that these requests will decrease the amount of time that you wait before being seen by our physicians.       _____________________________________________________________  Should you have questions after your visit to [facility], please contact our office at [phone] between the hours of 8:30 a.m. and 4:30 p.m.  Voicemails left after 4:30 p.m. will not be returned until the following business day.  For prescription refill requests, have your pharmacy  contact our office with your prescription refill request.    _______________________________________________________________  We hope that we have given you very good care.  You may receive a patient satisfaction survey in the mail, please complete it and return it as soon as possible.  We value your feedback!  _______________________________________________________________  Have you asked about our STAR program?  STAR stands for Survivorship Training and Rehabilitation, and this is a nationally recognized cancer care program that focuses on survivorship and rehabilitation.  Cancer and cancer treatments may cause problems, such as, pain, making you feel tired and keeping you from doing the things that you need or want to do. Cancer rehabilitation can help. Our goal is to reduce these troubling effects and help you have the best quality of life possible.  You may receive a survey from a nurse that asks questions about your current state of health.  Based on the survey results, all eligible patients will be referred to the [facility] program for an evaluation so we can better serve you!  A frequently asked questions sheet is available upon request.

## 2013-10-13 LAB — CANCER ANTIGEN 27.29: CA 27.29: 23 U/mL (ref 0–39)

## 2013-10-13 LAB — VITAMIN D 25 HYDROXY (VIT D DEFICIENCY, FRACTURES): Vit D, 25-Hydroxy: 36 ng/mL (ref 30–89)

## 2013-10-13 LAB — CEA: CEA: 4.3 ng/mL (ref 0.0–5.0)

## 2013-10-14 LAB — BETA 2 MICROGLOBULIN, SERUM: Beta-2 Microglobulin: 4.63 mg/L — ABNORMAL HIGH (ref <=2.51)

## 2013-10-25 ENCOUNTER — Ambulatory Visit (HOSPITAL_COMMUNITY): Payer: Medicare Other

## 2013-10-25 ENCOUNTER — Other Ambulatory Visit (HOSPITAL_COMMUNITY): Payer: Medicare Other

## 2013-11-01 ENCOUNTER — Ambulatory Visit (HOSPITAL_COMMUNITY)
Admission: RE | Admit: 2013-11-01 | Discharge: 2013-11-01 | Disposition: A | Discharge status: Home / Self Care | Payer: Medicare Other | Source: Ambulatory Visit | Attending: Hematology and Oncology | Admitting: Hematology and Oncology

## 2013-11-01 DIAGNOSIS — C50912 Malignant neoplasm of unspecified site of left female breast: Secondary | ICD-10-CM | POA: Diagnosis not present

## 2013-12-28 ENCOUNTER — Telehealth (HOSPITAL_COMMUNITY): Payer: Self-pay | Admitting: *Deleted

## 2013-12-28 NOTE — Telephone Encounter (Signed)
Patient's son Shanon Brow called Amy today and requested that we stop his Mother's "cancer pill". I called him back to obtain more info. He basically states that his Mother's other health problems are worsening. She is weak, pain in left side (near hip), kidney function is worsening, her hgb a1c is 10, and she has a yeast infection. David's request is that we stop the aromasin for 6 months then see her back. I suggested to him that she at least keep her appt with Dr. Whitney Muse on 12/30 to discuss this. I told him I would talk to MD and call him back with instructions. David's # is [id number].

## 2014-01-04 ENCOUNTER — Other Ambulatory Visit (HOSPITAL_COMMUNITY): Payer: Self-pay

## 2014-01-04 DIAGNOSIS — C9110 Chronic lymphocytic leukemia of B-cell type not having achieved remission: Secondary | ICD-10-CM

## 2014-01-04 DIAGNOSIS — C50912 Malignant neoplasm of unspecified site of left female breast: Secondary | ICD-10-CM

## 2014-01-10 NOTE — Progress Notes (Signed)
Carla Kilts, MD [place] [address]  Clinical Stage IIA (T2,N0,M0) invasive ductal carcinoma ER+, PR 0%, KI-67 32%, Her-2 neu negative  Neoadjuvant Arimidex 22m 06/17/2012,changed to Exemestane (Surgeon Dr.Newman)  Stage 0 CLL  Osteoporosis on yearly Reclast  History scabies  CURRENT THERAPY:  INTERVAL HISTORY: [name] CIRCLE80y.o. female returns for follow up of her breast cancer. She has recently stopped her aromasin because of worsening memory. She is however complaining of worsening breast pain in the area of her tumor. She thinks it has gotten bigger. They have not seen Dr. NLucia Gaskinsin some time.  Her son reports gradual decline over the last few years. She is more fatigued and her memory has gotten worse.  She asks the same questions over and over again. Her questions tend to be about things from the past.  [place]  MEDICAL HISTORY: Past Medical History  Diagnosis Date  . Diabetes mellitus   . Pneumonia     history  . Kidney stones     histroy  . Hx: UTI (urinary tract infection)   . CVA (cerebral infarction) 09/2009  . Breast cancer 05/05/12    left, ER+/PR-, Her 2 -  . Leukemia     CLL, Dr NTressie Stalker completed tx 2010  . COPD (chronic obstructive pulmonary disease)   . Hypothyroidism   . Stroke     x 3, last Nov 2011, right side weakness  . Neuropathy     diabetic- right side  . Invasive ductal carcinoma of left breast 05/13/2012  . Osteoporosis, unspecified 10/20/2012  . Osteoporosis, unspecified 10/11/2013    has Chronic lymphocytic leukemia; POLYP, COLON; Diabetes insipidus; HYPERLIPIDEMIA; CVA; TIA; COPD; DUODENAL ULCER; GASTRITIS, ACUTE; RENAL CALCULUS; DYSPNEA; S/P bilateral hip replacements; Back pain; Invasive ductal carcinoma of left breast; CAP (community acquired pneumonia); COPD with exacerbation; and Osteoporosis, unspecified on her problem list.     No history exists.     is  allergic to arimidex; cephalexin; levaquin; cranberry juice powder; avelox; ciprofloxacin; codeine; ibuprofen; lipitor; morphine sulfate; nitrofurantoin monohyd macro; paroxetine hcl; penicillins; salicylic acid; sulfa antibiotics; and vioxx.  Ms. MBarcelohad no medications administered during this visit.  SURGICAL HISTORY: Past Surgical History  Procedure Laterality Date  . Cardiac catheterization    . Lithotripsy    . Breast lumpectomy      left  breast  . Replacement total knee      right  . Appendectomy    . Ovarian cyst removal    . Total hip arthroplasty      left  . Bartholin gland cyst excision    . Total hip arthroplasty      right  . Cholecystectomy    . Abdominal hysterectomy      TAH-BSO, endometriosis  . Core biopsy left breast  04/2012  . [place] stone [place] Right 1979    [id number]   Social History  . Marital Status: Widowed    Spouse Name: [name]    Number of Children: [name]  . Years of Education: 11   Occupational History  .     Social History Main Topics  . Smoking status: Former Smoker -- 1.00 packs/day    Quit date: 01/14/1992  . Smokeless tobacco: Never Used  . Alcohol Use: Yes     Comment: very little  . Drug Use: No  . Sexual Activity: Not on  file     Comment: 4 miscarriages, P3, ? HRT   Other Topics Concern  . Not on file   Social History Narrative    FAMILY HISTORY: Family History  Problem Relation Age of Onset  . Diabetes    . Kidney disease    . Cancer Father     stomach  . Kidney disease Brother   . Cancer Brother     throat    Review of Systems  Constitutional: Positive for malaise/fatigue. Negative for fever, chills, weight loss and diaphoresis.  HENT: Negative for congestion, ear discharge, ear pain, hearing loss, nosebleeds, sore throat and tinnitus.   Eyes: Negative.   Respiratory: Negative.  Negative for stridor.   Cardiovascular: Negative.   Gastrointestinal: Negative.     Genitourinary: Positive for urgency and frequency. Negative for dysuria, hematuria and flank pain.  Musculoskeletal: Positive for myalgias and joint pain.  Skin: Negative.   Neurological: Positive for focal weakness and weakness. Negative for dizziness, tingling, tremors, sensory change, speech change, seizures, loss of consciousness and headaches.       Problems with memory, and ambulation  Endo/Heme/Allergies: Negative.   Psychiatric/Behavioral: Positive for memory loss. The patient is nervous/anxious.     PHYSICAL EXAMINATION  ECOG PERFORMANCE STATUS: 2 - Symptomatic, <50% confined to bed  Filed Vitals:   01/11/14 1226  BP: 112/61  Pulse: 59  Temp: 98.3 F (36.8 C)  Resp: 18    Physical Exam  Constitutional: She is oriented to person, place, and time and well-developed, well-nourished, and in no distress.  Unable to get onto the exam table   HENT:  Head: Normocephalic and atraumatic.  Nose: Nose normal.  Mouth/Throat: Oropharynx is clear and moist. No oropharyngeal exudate.  Eyes: Conjunctivae and EOM are normal. Pupils are equal, round, and reactive to light. Right eye exhibits no discharge. Left eye exhibits no discharge. No scleral icterus.  Neck: Normal range of motion. Neck supple. No tracheal deviation present. No thyromegaly present.  Cardiovascular: Normal rate and regular rhythm.  Exam reveals no gallop and no friction rub.   Murmur heard. Pulmonary/Chest: Effort normal and breath sounds normal. She has no wheezes. She has no rales.  Palpable L breast mass, inner upper quadrant, mobile, close to skin  Abdominal: Soft. Bowel sounds are normal. She exhibits no distension and no mass. There is no tenderness. There is no rebound and no guarding.  Musculoskeletal: Normal range of motion. She exhibits no edema.  Lymphadenopathy:    She has no cervical adenopathy.  Neurological: She is alert and oriented to person, place, and time. She has normal reflexes. No cranial  nerve deficit. Gait normal. Coordination normal.  Skin: Skin is warm and dry. No rash noted.  Psychiatric: Mood, memory, affect and judgment normal.  Nursing note and vitals reviewed.   LABORATORY DATA:  CBC    Component Value Date/Time   WBC 12.8* 01/11/2014 1150   RBC 4.59 01/11/2014 1150   RBC 4.43 07/12/2013 1408   HGB 14.4 01/11/2014 1150   HCT 43.7 01/11/2014 1150   PLT 163 01/11/2014 1150   MCV 95.2 01/11/2014 1150   MCH 31.4 01/11/2014 1150   MCHC 33.0 01/11/2014 1150   RDW 13.6 01/11/2014 1150   LYMPHSABS 6.8* 01/11/2014 1150   MONOABS 0.5 01/11/2014 1150   EOSABS 0.1 01/11/2014 1150   BASOSABS 0.0 01/11/2014 1150   CMP     Component Value Date/Time   NA 137 01/11/2014 1150   K 4.4 01/11/2014 1150  CL 104 01/11/2014 1150   CO2 26 01/11/2014 1150   GLUCOSE 153* 01/11/2014 1150   BUN 25* 01/11/2014 1150   CREATININE 1.23* 01/11/2014 1150   CALCIUM 9.7 01/11/2014 1150   PROT 7.3 01/11/2014 1150   ALBUMIN 4.2 01/11/2014 1150   AST 17 01/11/2014 1150   ALT 12 01/11/2014 1150   ALKPHOS 112 01/11/2014 1150   BILITOT 0.6 01/11/2014 1150   GFRNONAA 39* 01/11/2014 1150   GFRAA 45* 01/11/2014 1150     PENDING LABS:   RADIOGRAPHIC STUDIES:  CLINICAL DATA: Follow-up evaluation of patient with known left  breast invasive mammary carcinoma which is treated with anti  estrogen therapy. There has been no surgery.  EXAM:  ULTRASOUND OF THE left BREAST  COMPARISON: 12/29/2012 06/09/2012 04/28/2012.  FINDINGS:  On physical exam,there is no discrete palpable mass present on  today's evaluation.  Ultrasound is performed, showing an irregular, hypoechoic mass  located within the left breast at 10 o'clock position 8 cm from the  nipple corresponding to the known invasive mammary carcinoma. On  today's evaluation this measures 1.8 x 1.7 x 1.0 cm in size and  previously measured 2.1 x 1.7 x 1.3 cm in size (mild interval  decrease in size). There is no  other significant change.  IMPRESSION:  Mild interval decrease in size of the known left breast invasive  mammary carcinoma which now measures 1.8 x 1.7 x 1.0 cm in size.  RECOMMENDATION:  Treatment plan.  I have discussed the findings and recommendations with the patient.  Results were also provided in writing at the conclusion of the  visit. If applicable, a reminder letter will be sent to the patient  regarding the next appointment.  BI-RADS CATEGORY 6: Known biopsy-proven malignancy.  Electronically Signed  By: Luberta Robertson M.D.  On: 05/04/2013 13:01    CLINICAL DATA: Patient with known left breast invasive mammary carcinoma, treated with antiestrogen therapy.  EXAM: ULTRASOUND OF THE LEFT BREAST  COMPARISON: [facility]  FINDINGS: On physical exam, I palpate a small mass within the upper inner left breast.  Ultrasound is performed, showing an irregular hypoechoic taller than wide mass within the left breast 10 o'clock position 8 cm from the nipple measuring 1.9 x 1.7 x 1.6 cm. The mass is increased in size when compared to recent prior examination where it measured 1.8 x 1.7 x 1.0 cm. No left axillary lymphadenopathy.  IMPRESSION: Slight interval increase in size, particularly the height of the left breast mass compatible with known left breast malignancy.  RECOMMENDATION: Treatment plan  I have discussed the findings and recommendations with the patient. Results were also provided in writing at the conclusion of the visit. If applicable, a reminder letter will be sent to the patient regarding the next appointment.  BI-RADS CATEGORY 6: Known biopsy-proven malignancy - appropriate action should be taken.   Electronically Signed  By: Lovey Newcomer M.D.  On: 11/01/2013 12:32  ASSESSMENT and THERAPY PLAN:    Invasive ductal carcinoma of left breast Pleasant 80 year old female with a clinical stage IIa carcinoma of the left breast. Tumor  is ER positive PR negative and HER-2/neu negative. She was started on neoadjuvant Arimidex in June 2014 and then changed to exemestane. Family has recently discontinued exemestane, secondary to decline in not only her physical status but her memory. The recent ultrasound of the breast showed slight enlargement of the lesion. The patient also states she is having increasing pain in the left upper breast.  I discussed with the  patient and her son that if the ultrasound findings show worsening disease, and the patient has increasing pain, it is most likely the lesion is beginning to grow through endocrine therapy. They are not interested in continuing with endocrine therapy because they feel it worsens her memory. They are also however concerned about doing something in regards to the breast cancer. I advised them that she may be perfectly capable of undergoing a lumpectomy. That would offer a reasonable long-term local control, and given other medical issues her breast cancer may not be a further issue. I do fee if left unattended, moving forward we will have locoregional problems. The patient and her son would like to discuss this with the remainder of the family. They will call us to set up another appointment for additional discussion at that time.    All questions were answered. The patient knows to call the clinic with any problems, questions or concerns. We can certainly see the patient much sooner if necessary.   Molli Hazard 01/22/2014

## 2014-01-11 ENCOUNTER — Encounter (HOSPITAL_COMMUNITY): Payer: Self-pay | Admitting: Hematology & Oncology

## 2014-01-11 ENCOUNTER — Encounter (HOSPITAL_COMMUNITY): Payer: Medicare Other | Attending: Hematology & Oncology | Admitting: Hematology & Oncology

## 2014-01-11 ENCOUNTER — Encounter (HOSPITAL_BASED_OUTPATIENT_CLINIC_OR_DEPARTMENT_OTHER): Payer: Medicare Other

## 2014-01-11 VITALS — BP 112/61 | HR 59 | Temp 98.3°F | Resp 18 | Wt 180.6 lb

## 2014-01-11 DIAGNOSIS — C50912 Malignant neoplasm of unspecified site of left female breast: Secondary | ICD-10-CM

## 2014-01-11 DIAGNOSIS — R413 Other amnesia: Secondary | ICD-10-CM

## 2014-01-11 DIAGNOSIS — C9110 Chronic lymphocytic leukemia of B-cell type not having achieved remission: Secondary | ICD-10-CM

## 2014-01-11 DIAGNOSIS — G893 Neoplasm related pain (acute) (chronic): Secondary | ICD-10-CM

## 2014-01-11 DIAGNOSIS — Z79811 Long term (current) use of aromatase inhibitors: Secondary | ICD-10-CM

## 2014-01-11 DIAGNOSIS — Z170 Estrogen receptor positive status [ER+]: Secondary | ICD-10-CM

## 2014-01-11 LAB — CBC WITH DIFFERENTIAL/PLATELET
Basophils Absolute: 0 10*3/uL (ref 0.0–0.1)
Basophils Relative: 0 % (ref 0–1)
EOS PCT: 1 % (ref 0–5)
Eosinophils Absolute: 0.1 10*3/uL (ref 0.0–0.7)
HCT: 43.7 % (ref 36.0–46.0)
Hemoglobin: 14.4 g/dL (ref 12.0–15.0)
LYMPHS ABS: 6.8 10*3/uL — AB (ref 0.7–4.0)
Lymphocytes Relative: 53 % — ABNORMAL HIGH (ref 12–46)
MCH: 31.4 pg (ref 26.0–34.0)
MCHC: 33 g/dL (ref 30.0–36.0)
MCV: 95.2 fL (ref 78.0–100.0)
MONO ABS: 0.5 10*3/uL (ref 0.1–1.0)
Monocytes Relative: 4 % (ref 3–12)
NEUTROS PCT: 42 % — AB (ref 43–77)
Neutro Abs: 5.4 10*3/uL (ref 1.7–7.7)
Platelets: 163 10*3/uL (ref 150–400)
RBC: 4.59 MIL/uL (ref 3.87–5.11)
RDW: 13.6 % (ref 11.5–15.5)
WBC: 12.8 10*3/uL — ABNORMAL HIGH (ref 4.0–10.5)

## 2014-01-11 LAB — COMPREHENSIVE METABOLIC PANEL
ALK PHOS: 112 U/L (ref 39–117)
ALT: 12 U/L (ref 0–35)
AST: 17 U/L (ref 0–37)
Albumin: 4.2 g/dL (ref 3.5–5.2)
Anion gap: 7 (ref 5–15)
BILIRUBIN TOTAL: 0.6 mg/dL (ref 0.3–1.2)
BUN: 25 mg/dL — AB (ref 6–23)
CHLORIDE: 104 meq/L (ref 96–112)
CO2: 26 mmol/L (ref 19–32)
Calcium: 9.7 mg/dL (ref 8.4–10.5)
Creatinine, Ser: 1.23 mg/dL — ABNORMAL HIGH (ref 0.50–1.10)
GFR calc Af Amer: 45 mL/min — ABNORMAL LOW (ref >90)
GFR calc non Af Amer: 39 mL/min — ABNORMAL LOW (ref >90)
Glucose, Bld: 153 mg/dL — ABNORMAL HIGH (ref 70–99)
Potassium: 4.4 mmol/L (ref 3.5–5.1)
Sodium: 137 mmol/L (ref 135–145)
Total Protein: 7.3 g/dL (ref 6.0–8.3)

## 2014-01-11 NOTE — Progress Notes (Signed)
Lab draw

## 2014-01-11 NOTE — Patient Instructions (Addendum)
[facility] Discharge Instructions  RECOMMENDATIONS MADE BY THE CONSULTANT AND ANY TEST RESULTS WILL BE SENT TO YOUR REFERRING PHYSICIAN.  Please call when you have all discussed how you would like to proceed in regards to your breast cancer. We talked about going back to a surgeon to remove the area and get good local control of the cancer so you do not have problems down the road. We talked about trying other pills -- our final option would be Tamoxifen.  Thank you for choosing [facility] to provide your oncology and hematology care.  To afford each patient quality time with our providers, please arrive at least 15 minutes before your scheduled appointment time.  With your help, our goal is to use those 15 minutes to complete the necessary work-up to ensure our physicians have the information they need to help with your evaluation and healthcare recommendations.    Effective January 1st, 2014, we ask that you re-schedule your appointment with our physicians should you arrive 10 or more minutes late for your appointment.  We strive to give you quality time with our providers, and arriving late affects you and other patients whose appointments are after yours.    Again, thank you for choosing [facility].  Our hope is that these requests will decrease the amount of time that you wait before being seen by our physicians.       _____________________________________________________________  Should you have questions after your visit to [facility], please contact our office at [phone] between the hours of 8:30 a.m. and 5:00 p.m.  Voicemails left after 4:30 p.m. will not be returned until the following business day.  For prescription refill requests, have your pharmacy contact our office with your prescription refill request.

## 2014-01-12 LAB — CEA: CEA: 4.9 ng/mL (ref 0.0–5.0)

## 2014-01-12 LAB — CANCER ANTIGEN 27.29: CA 27.29: 37 U/mL (ref 0–39)

## 2014-01-17 ENCOUNTER — Telehealth (HOSPITAL_COMMUNITY): Payer: Self-pay | Admitting: *Deleted

## 2014-01-22 NOTE — Assessment & Plan Note (Signed)
Pleasant 81 year old female with a clinical stage IIa carcinoma of the left breast. Tumor is ER positive PR negative and HER-2/neu negative. She was started on neoadjuvant Arimidex in June 2014 and then changed to exemestane. Family has recently discontinued exemestane, secondary to decline in not only her physical status but her memory. The recent ultrasound of the breast showed slight enlargement of the lesion. The patient also states she is having increasing pain in the left upper breast.  I discussed with the patient and her son that if the ultrasound findings show worsening disease, and the patient has increasing pain, it is most likely the lesion is beginning to grow through endocrine therapy. They are not interested in continuing with endocrine therapy because they feel it worsens her memory. They are also however concerned about doing something in regards to the breast cancer. I advised them that she may be perfectly capable of undergoing a lumpectomy. That would offer a reasonable long-term local control, and given other medical issues her breast cancer may not be a further issue. I do fee if left unattended, moving forward we will have locoregional problems. The patient and her son would like to discuss this with the remainder of the family. They will call us to set up another appointment for additional discussion at that time.

## 2014-01-23 NOTE — Telephone Encounter (Signed)
Unable to reach anyone by phone, message left to call clinic.

## 2014-01-23 NOTE — Telephone Encounter (Signed)
-----   Message from Epifanio Lesches sent at 01/23/2014 11:59 AM EST ----- Pt's son Shanon Brow would like a call back he has some questions about his moms "growing cancer"

## 2014-01-24 ENCOUNTER — Telehealth (HOSPITAL_COMMUNITY): Payer: Self-pay

## 2014-01-24 NOTE — Telephone Encounter (Signed)
Call from son wanting to talk with Dr. Whitney Muse and possibly have sister on conference call to discuss treatment options, potential side effects, etc.  Instructed that would be better to have face to face discussion with MD.  Discussed with Dr. Whitney Muse and she prefers to have face to face discussion with both son and daughter and appointment was scheduled for 02/10/14.

## 2014-02-10 ENCOUNTER — Encounter (HOSPITAL_COMMUNITY): Payer: Medicare Other | Attending: Hematology & Oncology | Admitting: Hematology & Oncology

## 2014-03-12 NOTE — Assessment & Plan Note (Signed)
On calcium and Vit D.  Reclast supportive therapy plan built, but has not been given to date.

## 2014-03-12 NOTE — Progress Notes (Signed)
Carla Kilts, MD [place] [address]  Invasive ductal carcinoma of left breast  Chronic lymphocytic leukemia  Osteoporosis  CURRENT THERAPY: Patient and family have stopped neoadjuvant endocrine therapy.  INTERVAL HISTORY: [name] 81 y.o. female returns for followup of stage IIa carcinoma of the left breast. Tumor is ER positive PR negative and HER-2/neu negative. She was started on neoadjuvant Arimidex in June 2014 and then changed to exemestane. Family has recently discontinued exemestane, secondary to decline in not only her physical status but her memory. The recent ultrasound of the breast showed slight enlargement of the lesion.   Recently she presented to [facility] ED for chest pain from her nursing facility.  She was admitted for a short time at the hospital.  Part of her work-up was a CT angio of chest which demonstrated multiple rib fractures and a lytic lesion in the right, medial 9th rib that is suspected to be secondary to breast cancer, upstaging her to Stage IV breast cancer.  The family report that she did not tolerate oral endocrine therapy for her breast cancer and therefore aggressive systemic therapy is not indicated.  The family ask about more staging scans but the utility of these scans are not beneficial given her lack of interest in pursuing therapy.  They also question a bone test and biopsy, again utility is lacking.  We had a long conversation regarding Hospice.  The patient is aware that chemotherapy will adversely affect quality of life and may not prove to be beneficial.  Patient education was given regarding Hospice and the services they provide.  The patient understands that studies report that early enrollment in Hospice actually allows the patient to live longer compared to those who enroll in Hospice nearer to end of life. Hospice will allow the patient to stay at home at end of life or go to a facility for  end of life care. Hospice provides the patient with a team of providers to help with care including physicians, nurses, aids, chaplains, and social workers.  Hospice's goal is to manage symptoms related to her malignancy. The patient is certainly Hospice appropriate with a life expectancy of less than 6 months.  Order placed for Hospice referral.   Pain control is an issue for the patient.  I have increased the frequency of her Hydrocodone since the family is concerned about more potent opioids at this time.    Past Medical History  Diagnosis Date  . Diabetes mellitus   . Pneumonia     history  . Kidney stones     histroy  . Hx: UTI (urinary tract infection)   . CVA (cerebral infarction) 09/2009  . Breast cancer 05/05/12    left, ER+/PR-, Her 2 -  . Leukemia     CLL, Dr Tressie Stalker, completed tx 2010  . COPD (chronic obstructive pulmonary disease)   . Hypothyroidism   . Stroke     x 3, last Nov 2011, right side weakness  . Neuropathy     diabetic- right side  . Invasive ductal carcinoma of left breast 05/13/2012  . Osteoporosis, unspecified 10/20/2012  . Osteoporosis, unspecified 10/11/2013    has Chronic lymphocytic leukemia; POLYP, COLON; Diabetes insipidus; HYPERLIPIDEMIA; CVA; TIA; COPD; DUODENAL ULCER; GASTRITIS, ACUTE; RENAL CALCULUS; DYSPNEA; S/P bilateral hip replacements; Back pain; Invasive ductal carcinoma of left breast; CAP (community acquired pneumonia); COPD with exacerbation; and Osteoporosis on her problem list.  is allergic to arimidex; cephalexin; levaquin; cranberry juice powder; avelox; ciprofloxacin; codeine; ibuprofen; lipitor; morphine sulfate; nitrofurantoin monohyd macro; paroxetine hcl; penicillins; salicylic acid; sulfa antibiotics; and vioxx.  [name] does not currently have medications on file.  Past Surgical History  Procedure Laterality Date  . Cardiac catheterization    . Lithotripsy    . Breast lumpectomy      left  breast  . Replacement total  knee      right  . Appendectomy    . Ovarian cyst removal    . Total hip arthroplasty      left  . Bartholin gland cyst excision    . Total hip arthroplasty      right  . Cholecystectomy    . Abdominal hysterectomy      TAH-BSO, endometriosis  . Core biopsy left breast  04/2012  . Kidney stone surgery Right 1979    In [place], [place]    Denies any headaches, dizziness, double vision, fevers, chills, night sweats, nausea, vomiting, diarrhea, constipation, chest pain, heart palpitations, shortness of breath, blood in stool, black tarry stool, urinary pain, urinary burning, urinary frequency, hematuria.   PHYSICAL EXAMINATION  ECOG PERFORMANCE STATUS: 2 - Symptomatic, <50% confined to bed  Filed Vitals:   03/13/14 1410  BP: 126/44  Pulse: 72  Temp: 97.5 F (36.4 C)  Resp: 18    GENERAL:alert, no distress, well nourished, well developed, comfortable, cooperative and smiling, accompanied by son and daughter. SKIN: skin color, texture, turgor are normal, no rashes or significant lesions HEAD: Normocephalic, No masses, lesions, tenderness or abnormalities EYES: normal, PERRLA, EOMI, Conjunctiva are pink and non-injected EARS: External ears normal OROPHARYNX:mucous membranes are moist  NECK: supple, trachea midline LYMPH:  not examined BREAST:not examined EXTREMITIES:less then 2 second capillary refill, no skin discoloration  NEURO: pleasantly confused and in a wheelchair   LABORATORY DATA: CBC    Component Value Date/Time   WBC 12.8* 01/11/2014 1150   RBC 4.59 01/11/2014 1150   RBC 4.43 07/12/2013 1408   HGB 14.4 01/11/2014 1150   HCT 43.7 01/11/2014 1150   PLT 163 01/11/2014 1150   MCV 95.2 01/11/2014 1150   MCH 31.4 01/11/2014 1150   MCHC 33.0 01/11/2014 1150   RDW 13.6 01/11/2014 1150   LYMPHSABS 6.8* 01/11/2014 1150   MONOABS 0.5 01/11/2014 1150   EOSABS 0.1 01/11/2014 1150   BASOSABS 0.0 01/11/2014 1150      Chemistry      Component Value Date/Time   NA  137 01/11/2014 1150   K 4.4 01/11/2014 1150   CL 104 01/11/2014 1150   CO2 26 01/11/2014 1150   BUN 25* 01/11/2014 1150   CREATININE 1.23* 01/11/2014 1150      Component Value Date/Time   CALCIUM 9.7 01/11/2014 1150   ALKPHOS 112 01/11/2014 1150   AST 17 01/11/2014 1150   ALT 12 01/11/2014 1150   BILITOT 0.6 01/11/2014 1150        ASSESSMENT AND PLAN:  Invasive ductal carcinoma of left breast Stage IIa carcinoma of the left breast. Tumor is ER positive PR negative and HER-2/neu negative. She was started on neoadjuvant Arimidex in June 2014 and then changed to exemestane. Family has recently discontinued exemestane, secondary to decline in not only her physical status but her memory. She presented to [facility] ED a few days ago for chest pain.  CT of chest was performed demonstrating a right 9th rib lytic lesion indicative of Stage IV metastatic breast cancer.  Patient and family is educated that her breast cancer is not incurable.  She is not a candidate for systemic therapy given her comorbidities.  She is not interested in intervention.  "I want my pain better controlled."  Referral to Hospice is in the patient's best interest.  Will increase Hydrocodone frequency to one tablet PO every 2-3 hours (not to exceed 12 tablets in 24 hour period).  Return to [facility] on a PRN basis.     Chronic lymphocytic leukemia Not in need of therapy.  NCCN guidelines for indication for treatment of CLL are:  A. Eligible for clinical trial  B. Significant disease-related symptoms   1. Fatigue (severe)   2. Night sweats   3. Weight loss   4. Fever without infection  C. Threatened end-organ function  D. Progressive bulky disease (spleen >6cm below costal margin, lymph nodes >10 cm)  E. Progressive anemia  F. Progressive thrombocytopenia.    Osteoporosis On calcium and Vit D.  Reclast supportive therapy plan built, but has not been given to date.   THERAPY PLAN:  Referral to Hospice.  All  questions were answered. The patient knows to call the clinic with any problems, questions or concerns. We can certainly see the patient much sooner if necessary.  Patient and plan discussed with Dr. Ancil Linsey and she is in agreement with the aforementioned.   This note is electronically signed by: Robynn Pane 03/13/2014 3:11 PM

## 2014-03-12 NOTE — Assessment & Plan Note (Addendum)
Stage IIa carcinoma of the left breast. Tumor is ER positive PR negative and HER-2/neu negative. She was started on neoadjuvant Arimidex in June 2014 and then changed to exemestane. Family has recently discontinued exemestane, secondary to decline in not only her physical status but her memory. She presented to [facility] ED a few days ago for chest pain.  CT of chest was performed demonstrating a right 9th rib lytic lesion indicative of Stage IV metastatic breast cancer.  Patient and family is educated that her breast cancer is not incurable.  She is not a candidate for systemic therapy given her comorbidities.  She is not interested in intervention.  "I want my pain better controlled."  Referral to Hospice is in the patient's best interest.  Will increase Hydrocodone frequency to one tablet PO every 2-3 hours (not to exceed 12 tablets in 24 hour period).  Return to [facility] on a PRN basis.

## 2014-03-12 NOTE — Assessment & Plan Note (Signed)
Not in need of therapy.  NCCN guidelines for indication for treatment of CLL are:  A. Eligible for clinical trial  B. Significant disease-related symptoms   1. Fatigue (severe)   2. Night sweats   3. Weight loss   4. Fever without infection  C. Threatened end-organ function  D. Progressive bulky disease (spleen >6cm below costal margin, lymph nodes >10 cm)  E. Progressive anemia  F. Progressive thrombocytopenia.

## 2014-03-13 ENCOUNTER — Encounter (HOSPITAL_COMMUNITY): Payer: Medicare Other | Attending: Oncology | Admitting: Oncology

## 2014-03-13 ENCOUNTER — Encounter (HOSPITAL_COMMUNITY): Payer: Self-pay | Admitting: Lab

## 2014-03-13 VITALS — BP 126/44 | HR 72 | Temp 97.5°F | Resp 18

## 2014-03-13 DIAGNOSIS — M810 Age-related osteoporosis without current pathological fracture: Secondary | ICD-10-CM

## 2014-03-13 DIAGNOSIS — C50912 Malignant neoplasm of unspecified site of left female breast: Secondary | ICD-10-CM

## 2014-03-13 DIAGNOSIS — Z170 Estrogen receptor positive status [ER+]: Secondary | ICD-10-CM

## 2014-03-13 DIAGNOSIS — C50212 Malignant neoplasm of upper-inner quadrant of left female breast: Secondary | ICD-10-CM

## 2014-03-13 DIAGNOSIS — C9110 Chronic lymphocytic leukemia of B-cell type not having achieved remission: Secondary | ICD-10-CM

## 2014-03-13 NOTE — Patient Instructions (Signed)
[place] at [facility] Discharge Instructions  RECOMMENDATIONS MADE BY THE CONSULTANT AND ANY TEST RESULTS WILL BE SENT TO YOUR REFERRING PHYSICIAN.  Hospice referral. You should expect someone to call within 1-2 days to set up appointment. We will not schedule any return appointments to our clinic but if you feel you need to be seen here, call for an appointment. Hospice will take care of all communication to the doctor for you. Increase Hydrocodone to one tablet every 2-3 hours but do not take more than 12 tablets in a 24 hour period. Call clinic as needed with any issues/concerns.  Thank you for choosing [place] at [facility] to provide your oncology and hematology care.  To afford each patient quality time with our provider, please arrive at least 15 minutes before your scheduled appointment time.    You need to re-schedule your appointment should you arrive 10 or more minutes late.  We strive to give you quality time with our providers, and arriving late affects you and other patients whose appointments are after yours.  Also, if you no show three or more times for appointments you may be dismissed from the clinic at the providers discretion.     Again, thank you for choosing [facility].  Our hope is that these requests will decrease the amount of time that you wait before being seen by our physicians.       _____________________________________________________________  Should you have questions after your visit to [facility], please contact our office at [phone] between the hours of 8:30 a.m. and 4:30 p.m.  Voicemails left after 4:30 p.m. will not be returned until the following business day.  For prescription refill requests, have your pharmacy contact our office.

## 2014-03-13 NOTE — Progress Notes (Signed)
Referral for [facility].  Records faxed on 2/29

## 2014-04-14 DEATH — deceased

## 2014-12-08 IMAGING — US US BREAST LTD UNI LEFT INC AXILLA
1 series · 8 of 8 positions shown · non-contrast
Comparison: 12/29/2012 06/09/2012 04/28/2012.

CLINICAL DATA: Follow-up evaluation of patient with known left
breast invasive mammary carcinoma which is treated with anti
estrogen therapy. There has been no surgery.

EXAM:
ULTRASOUND OF THE left BREAST

[Series 1: us breast ltd uni left inc axilla · 0.06mm/px · 8 of 8 slices shown]
[im 1/8]
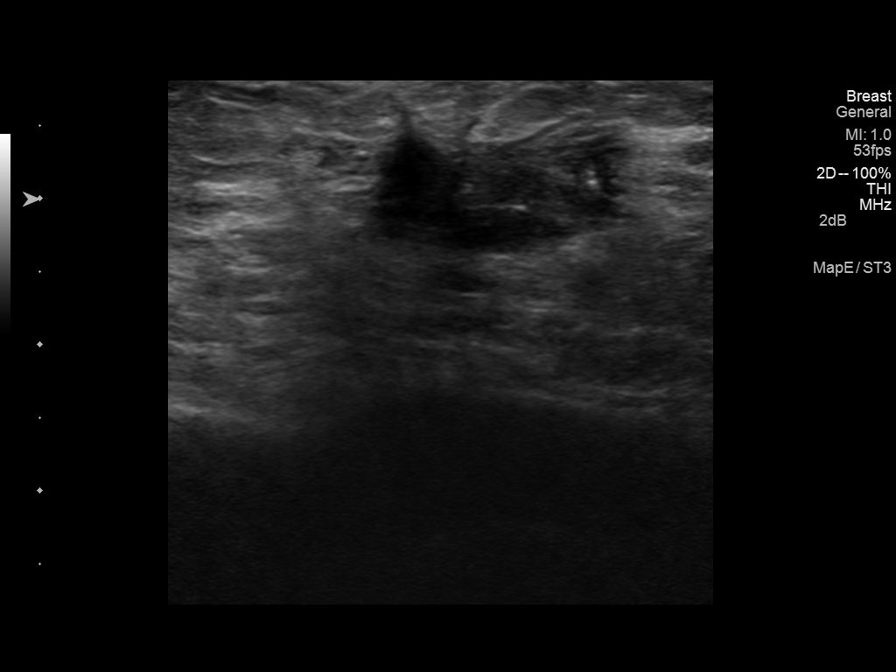
[im 2/8]
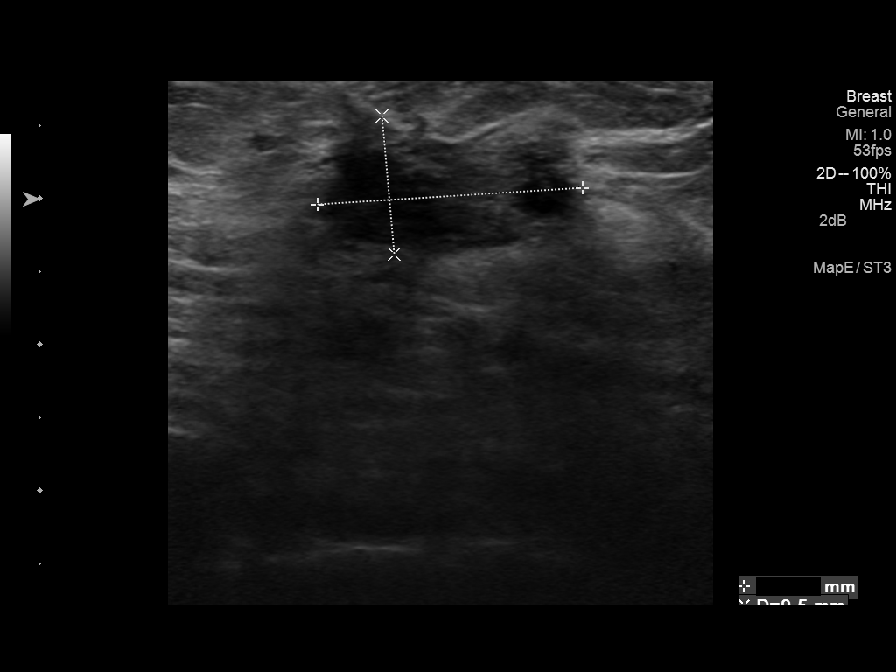
[im 3/8]
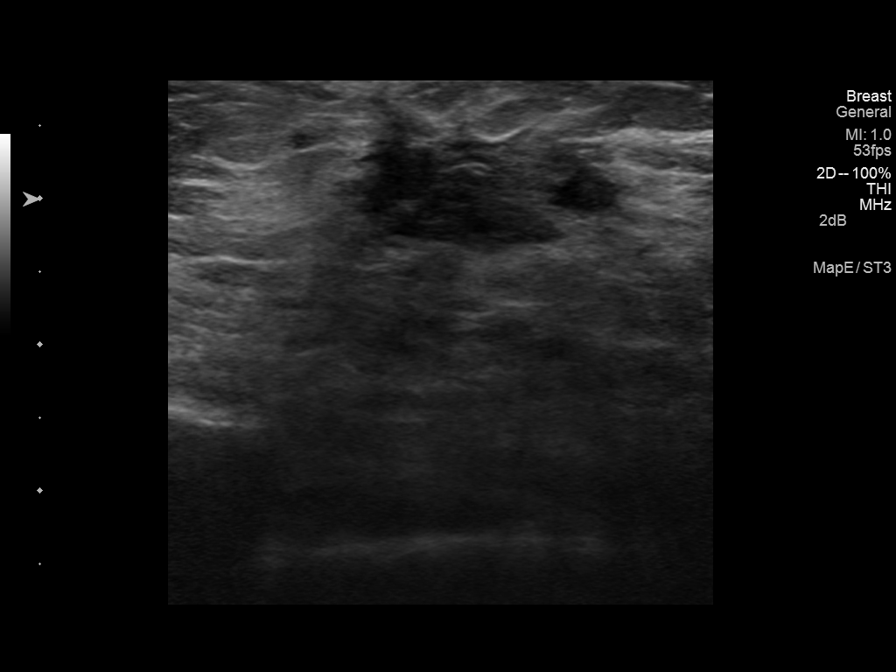
[im 4/8]
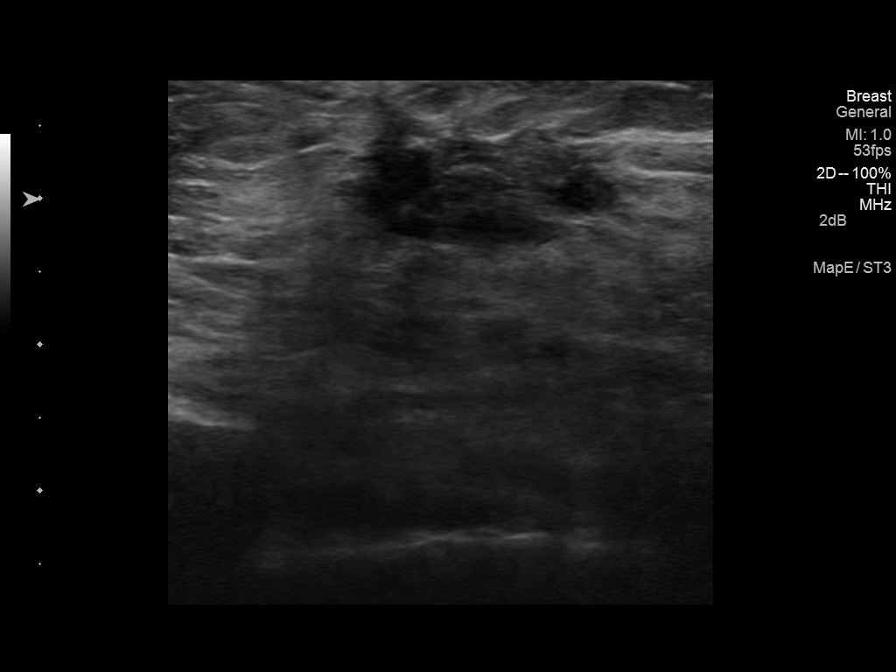
[im 5/8]
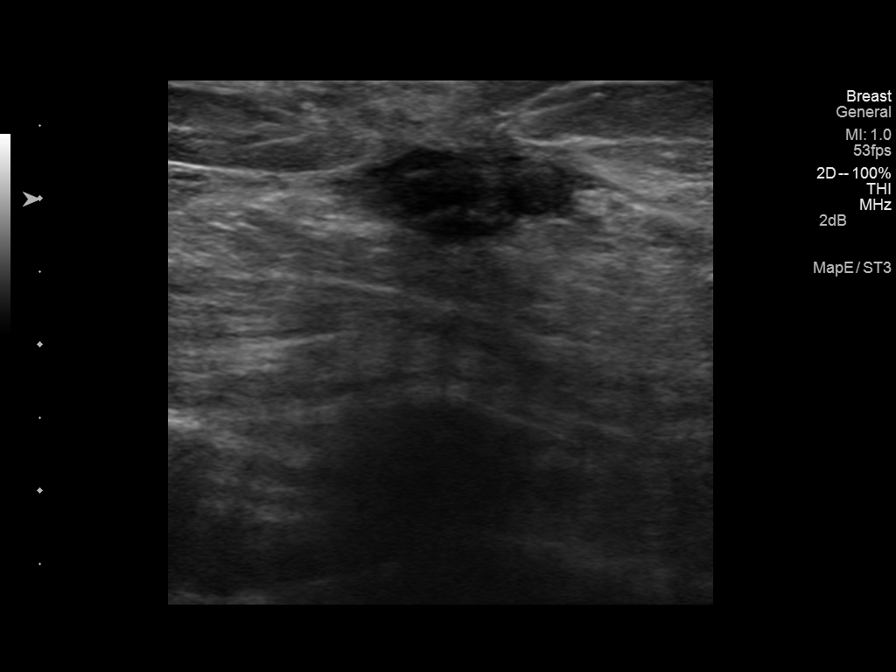
[im 6/8]
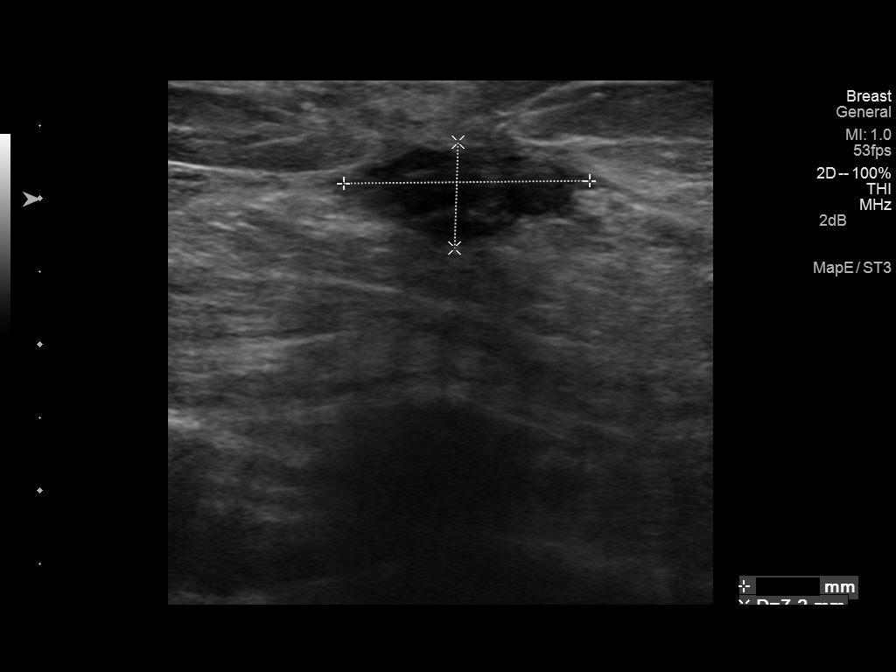
[im 7/8]
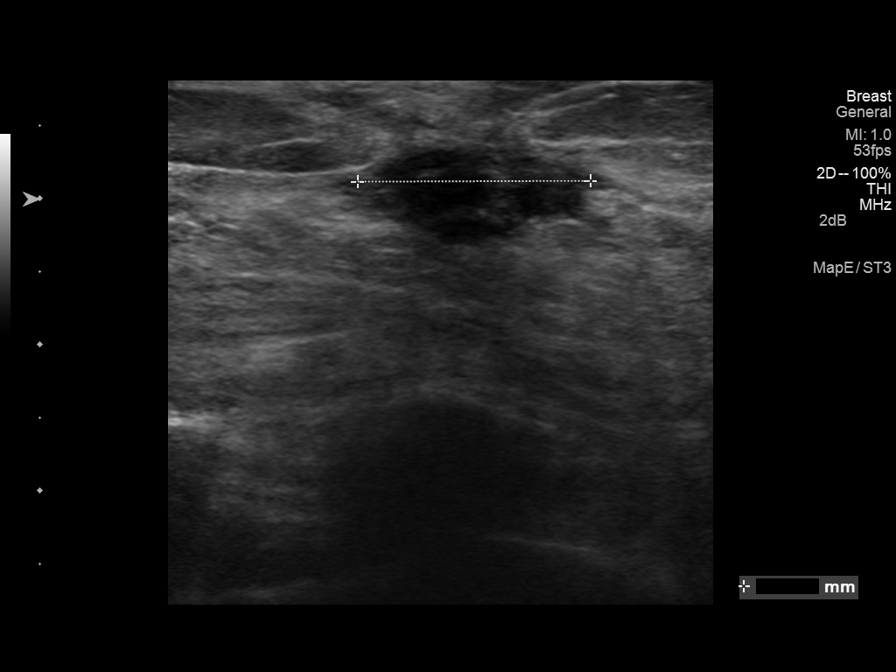
[im 8/8]
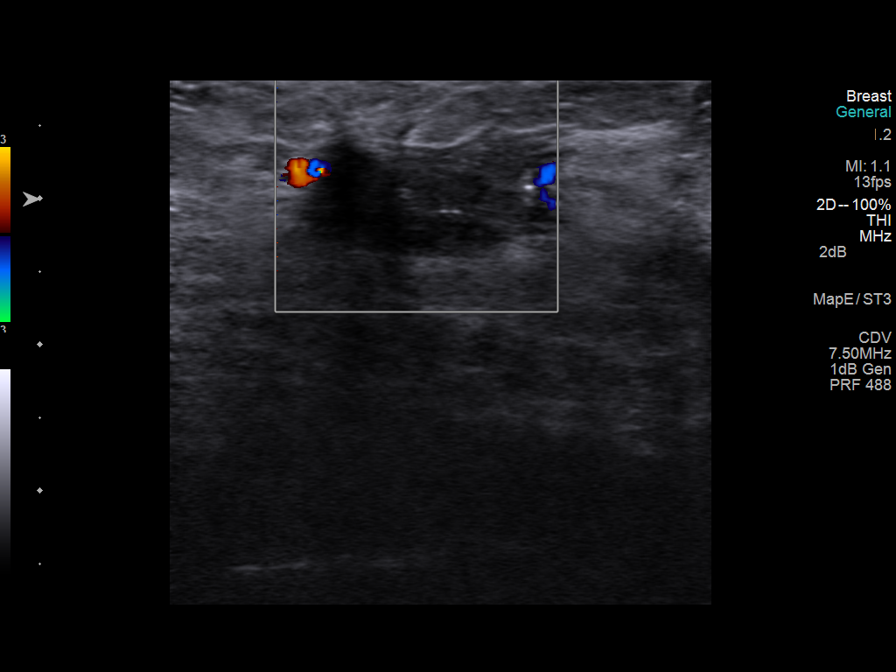

[8 of 8 positions shown; findings below may reference images not displayed]

FINDINGS: On physical exam,there is no discrete palpable mass present on
today's evaluation.

Ultrasound is performed, showing an irregular, hypoechoic mass
located within the left breast at 10 o'clock position 8 cm from the
nipple corresponding to the known invasive mammary carcinoma. On
today's evaluation this measures 1.8 x 1.7 x 1.0 cm in size and
previously measured 2.1 x 1.7 x 1.3 cm in size (mild interval
decrease in size). There is no other significant change.
IMPRESSION: Mild interval decrease in size of the known left breast invasive
mammary carcinoma which now measures 1.8 x 1.7 x 1.0 cm in size.

RECOMMENDATION:
Treatment plan.

I have discussed the findings and recommendations with the patient.
Results were also provided in writing at the conclusion of the
visit. If applicable, a reminder letter will be sent to the patient
regarding the next appointment.

BI-RADS CATEGORY  6: Known biopsy-proven malignancy.

## 2015-07-04 ENCOUNTER — Other Ambulatory Visit: Payer: Self-pay | Admitting: Nurse Practitioner
